# Patient Record
Sex: Male | Born: 1985 | Race: White | Hispanic: No | Marital: Single | Smoking: Former smoker
Health system: Southern US, Community
[De-identification: ages and names within clinical notes are randomized; demographics above are authoritative.]

## PROBLEM LIST (undated history)

## (undated) DIAGNOSIS — I10 Essential (primary) hypertension: Secondary | ICD-10-CM

## (undated) DIAGNOSIS — B019 Varicella without complication: Secondary | ICD-10-CM

## (undated) DIAGNOSIS — F1020 Alcohol dependence, uncomplicated: Secondary | ICD-10-CM

## (undated) DIAGNOSIS — B1920 Unspecified viral hepatitis C without hepatic coma: Secondary | ICD-10-CM

## (undated) DIAGNOSIS — F1910 Other psychoactive substance abuse, uncomplicated: Secondary | ICD-10-CM

## (undated) HISTORY — DX: Varicella without complication: B01.9

## (undated) HISTORY — DX: Alcohol dependence, uncomplicated: F10.20

---
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
## 2005-12-19 ENCOUNTER — Ambulatory Visit: Payer: Self-pay | Admitting: Internal Medicine

## 2006-11-06 ENCOUNTER — Ambulatory Visit: Payer: Self-pay | Admitting: Internal Medicine

## 2006-11-07 ENCOUNTER — Emergency Department (HOSPITAL_COMMUNITY): Admission: EM | Admit: 2006-11-07 | Discharge: 2006-11-07 | Payer: Self-pay | Admitting: Emergency Medicine

## 2007-03-30 ENCOUNTER — Telehealth (INDEPENDENT_AMBULATORY_CARE_PROVIDER_SITE_OTHER): Payer: Self-pay | Admitting: *Deleted

## 2008-01-19 ENCOUNTER — Telehealth: Payer: Self-pay | Admitting: Internal Medicine

## 2008-03-26 HISTORY — PX: HERNIA REPAIR: SHX51

## 2008-04-08 ENCOUNTER — Emergency Department (HOSPITAL_COMMUNITY): Admission: EM | Admit: 2008-04-08 | Discharge: 2008-04-08 | Payer: Self-pay | Admitting: Emergency Medicine

## 2008-04-10 ENCOUNTER — Emergency Department (HOSPITAL_COMMUNITY): Admission: EM | Admit: 2008-04-10 | Discharge: 2008-04-10 | Payer: Self-pay | Admitting: Emergency Medicine

## 2008-04-11 ENCOUNTER — Encounter: Payer: Self-pay | Admitting: Internal Medicine

## 2008-06-14 ENCOUNTER — Encounter: Payer: Self-pay | Admitting: Internal Medicine

## 2008-07-05 ENCOUNTER — Ambulatory Visit: Payer: Self-pay | Admitting: Internal Medicine

## 2008-07-05 DIAGNOSIS — M4120 Other idiopathic scoliosis, site unspecified: Secondary | ICD-10-CM | POA: Insufficient documentation

## 2008-07-05 DIAGNOSIS — S42309A Unspecified fracture of shaft of humerus, unspecified arm, initial encounter for closed fracture: Secondary | ICD-10-CM | POA: Insufficient documentation

## 2008-07-05 DIAGNOSIS — Z9189 Other specified personal risk factors, not elsewhere classified: Secondary | ICD-10-CM | POA: Insufficient documentation

## 2008-07-05 DIAGNOSIS — J069 Acute upper respiratory infection, unspecified: Secondary | ICD-10-CM | POA: Insufficient documentation

## 2008-07-15 ENCOUNTER — Emergency Department (HOSPITAL_COMMUNITY): Admission: EM | Admit: 2008-07-15 | Discharge: 2008-07-16 | Payer: Self-pay | Admitting: Emergency Medicine

## 2009-08-16 ENCOUNTER — Emergency Department (HOSPITAL_COMMUNITY): Admission: EM | Admit: 2009-08-16 | Discharge: 2009-08-16 | Payer: Self-pay | Admitting: Emergency Medicine

## 2009-12-07 ENCOUNTER — Ambulatory Visit: Payer: Self-pay | Admitting: Internal Medicine

## 2009-12-07 DIAGNOSIS — J019 Acute sinusitis, unspecified: Secondary | ICD-10-CM

## 2010-09-25 NOTE — Assessment & Plan Note (Signed)
Summary: CONGESTES//PH   Vital Signs:  Patient profile:   25 year old male Weight:      201.6 pounds Temp:     98.6 degrees F oral Pulse rate:   80 / minute Resp:     15 per minute BP sitting:   116 / 78  (left arm) Cuff size:   large  Vitals Entered By: Shonna Chock (December 07, 2009 10:41 AM) CC: Congestion and intense sinus pressure since Saturday Comments REVIEWED MED LIST, PATIENT AGREED DOSE AND INSTRUCTION CORRECT    CC:  Congestion and intense sinus pressure since Saturday.  History of Present Illness: Onset 12/02/2009 as R frontal headache followed by initial improvement until 04/12. Now headache  has recurred with pain in OD. Rx: antihistamine, Mucinex D, NSAIDS  Allergies (verified): No Known Drug Allergies  Review of Systems General:  Denies chills, fever, and sweats. Eyes:  Complains of discharge and eye pain; denies red eye and vision loss-1 eye. ENT:  Complains of sinus pressure and sore throat; denies ear discharge and earache; No facial pain; purulence from  R nare since last week. Resp:  Complains of cough, sputum productive, and wheezing; denies shortness of breath; dark yellow-brown sputum , > volume from head. No PMH of asthma. He quick smoking 1 week ago.  Physical Exam  General:  well-nourished,in no acute distress; alert,appropriate and cooperative throughout examination Eyes:  No corneal or conjunctival inflammation noted. EOMI. Perrla. Slight ptosis OD. Vision grossly normal. Ears:  External ear exam shows no significant lesions or deformities.  Otoscopic examination reveals clear canals, tympanic membranes are intact bilaterally without bulging, retraction, inflammation or discharge. Hearing is grossly normal bilaterally. Nose:  External nasal examination shows no deformity or inflammation. Nasal mucosa are erythematous without lesions or exudates. Mouth:  Oral mucosa and oropharynx without lesions or exudates.  Teeth in good repair. Lungs:  Normal  respiratory effort, chest expands symmetrically. Lungs are clear to auscultation, no crackles or wheezes. Heart:  Normal rate and regular rhythm. S1 and S2 normal without gallop, murmur, click, rub.S4  Cervical Nodes:  No lymphadenopathy noted Axillary Nodes:  No palpable lymphadenopathy   Impression & Recommendations:  Problem # 1:  SINUSITIS- ACUTE-NOS (ICD-461.9) No clinical evidence of conjuctivitis or orbital cellulitis The following medications were removed from the medication list:    Amoxicillin 500 Mg Caps (Amoxicillin) .Marland Kitchen... 1 three times a day His updated medication list for this problem includes:    Amoxicillin-pot Clavulanate 875-125 Mg Tabs (Amoxicillin-pot clavulanate) .Marland Kitchen... 1 q 12 hrs with a meal  Complete Medication List: 1)  Amoxicillin-pot Clavulanate 875-125 Mg Tabs (Amoxicillin-pot clavulanate) .Marland Kitchen.. 1 q 12 hrs with a meal  Patient Instructions: 1)  Neti pot once daily - two times a day from open to closed side. Nasal spray sample two times a day to Rnasal passage. 2)  Drink as much fluid as you can tolerate for the next few days. Prescriptions: AMOXICILLIN-POT CLAVULANATE 875-125 MG TABS (AMOXICILLIN-POT CLAVULANATE) 1 q 12 hrs with a meal  #20 x 0   Entered and Authorized by:   Marga Melnick MD   Signed by:   Marga Melnick MD on 12/07/2009   Method used:   Faxed to ...       [facility] (retail)       [address]       [place], [place]  [address]       Ph: [phone]  Fax: [phone]   RxID:   [id number]

## 2010-11-26 LAB — CBC
HCT: 45.5 % (ref 39.0–52.0)
Hemoglobin: 15.6 g/dL (ref 13.0–17.0)
MCHC: 34.3 g/dL (ref 30.0–36.0)
MCV: 95.5 fL (ref 78.0–100.0)
Platelets: 166 10*3/uL (ref 150–400)
RBC: 4.76 MIL/uL (ref 4.22–5.81)
RDW: 11.8 % (ref 11.5–15.5)
WBC: 6.1 10*3/uL (ref 4.0–10.5)

## 2010-11-26 LAB — POCT I-STAT, CHEM 8
BUN: 9 mg/dL (ref 6–23)
Calcium, Ion: 1.09 mmol/L — ABNORMAL LOW (ref 1.12–1.32)
Chloride: 107 mEq/L (ref 96–112)
Glucose, Bld: 102 mg/dL — ABNORMAL HIGH (ref 70–99)

## 2010-11-26 LAB — RAPID URINE DRUG SCREEN, HOSP PERFORMED
Amphetamines: NOT DETECTED
Barbiturates: NOT DETECTED
Benzodiazepines: NOT DETECTED
Cocaine: POSITIVE — AB
Opiates: NOT DETECTED
Tetrahydrocannabinol: POSITIVE — AB

## 2010-11-26 LAB — DIFFERENTIAL
Lymphocytes Relative: 28 % (ref 12–46)
Monocytes Absolute: 1.1 10*3/uL — ABNORMAL HIGH (ref 0.1–1.0)
Monocytes Relative: 18 % — ABNORMAL HIGH (ref 3–12)
Neutro Abs: 2.9 10*3/uL (ref 1.7–7.7)

## 2011-05-24 LAB — URINALYSIS, ROUTINE W REFLEX MICROSCOPIC
Bilirubin Urine: NEGATIVE
Glucose, UA: NEGATIVE
Ketones, ur: NEGATIVE
pH: 6

## 2011-05-28 LAB — URINALYSIS, ROUTINE W REFLEX MICROSCOPIC
Glucose, UA: NEGATIVE
Protein, ur: NEGATIVE
pH: 6

## 2011-05-28 LAB — DIFFERENTIAL
Basophils Absolute: 0
Lymphocytes Relative: 21
Monocytes Absolute: 0.8
Neutro Abs: 6
Neutrophils Relative %: 63

## 2011-05-28 LAB — CBC
HCT: 43.7
Hemoglobin: 15.1
MCV: 94.6
Platelets: 215
WBC: 9.6

## 2011-05-28 LAB — COMPREHENSIVE METABOLIC PANEL
Albumin: 3.5
BUN: 13
Chloride: 106
Creatinine, Ser: 1.29
GFR calc non Af Amer: >60
Glucose, Bld: 97
Total Bilirubin: 0.9

## 2011-05-28 LAB — RAPID URINE DRUG SCREEN, HOSP PERFORMED
Barbiturates: NOT DETECTED
Benzodiazepines: POSITIVE — AB
Cocaine: POSITIVE — AB

## 2011-07-02 ENCOUNTER — Ambulatory Visit (HOSPITAL_COMMUNITY)
Admission: RE | Admit: 2011-07-02 | Discharge: 2011-07-02 | Disposition: A | Discharge status: Home / Self Care | Payer: BC Managed Care – PPO | Attending: Psychiatry | Admitting: Psychiatry

## 2011-07-02 DIAGNOSIS — F1020 Alcohol dependence, uncomplicated: Secondary | ICD-10-CM | POA: Insufficient documentation

## 2011-07-02 NOTE — Progress Notes (Signed)
Assessment Note   Charles Hunter is an 25 y.o. male.   Axis I: Substance Abuse and ALCOHOL DEPENDENCE Axis II: Deferred Axis III: No past medical history on file. Axis IV: problems related to social environment Axis V: 41-50 serious symptoms  Past Medical History: No past medical history on file.  No past surgical history on file.  Family History: No family history on file.  Social History:  does not have a smoking history on file. He does not have any smokeless tobacco history on file. His alcohol and drug histories not on file.  Allergies: Allergies not on file  Home Medications:  No current outpatient prescriptions on file as of 07/02/2011.   No current facility-administered medications on file as of 07/02/2011.    OB/GYN Status:  No LMP for male patient.   PT PRESENTS REQUESTING HELP WITH IS SUBSTANCE ABUSE & STAYING SOBER. PT STATES HE WAS SOBER FOR 4 YRS & RELAPSED AT AGE 1. PT ADMITS DRINKING REGULARLY AS WELL AS BINGES. PT STATES HE HAD TRIED DOING CD IOP WITH [facility] BUT FELT THAT THE PROGRAM WAS NOT HELPFUL. PT STATES HE HAS ABUSED COCAINE, ETOH, THC & SPEED(METH). PT EXPRESSED HE HAS NOT HAD ANY SLEEP IN DAYS & EATING HAS NOT BEEN GOOD. PT DENIES SI, HI OR AV. PT EXPRESSED THAT HE DID NOT WANT TO BE INPT CAUSE HE HAD A LOT OF THINGS THAT REQUIRED HIS ATTENTION AT HOME. PT ADMITS TO SOME WITHDRAWAL SYMPTOMS INCLUDING ANXIETY, AGITATED. PT WAS FIDIGITY, UNEASY, SALAD WORDS. PT HAS AGREED TO FOLLOW UP WITH ANN EVANS FOR ADMISSION INTO CD IOP.                                          Disposition:     On Site Evaluation by:   Reviewed with Physician:     Waldron Session 07/02/2011 3:01 PM

## 2011-07-03 ENCOUNTER — Encounter (HOSPITAL_COMMUNITY): Payer: Self-pay | Admitting: *Deleted

## 2011-07-03 ENCOUNTER — Encounter (HOSPITAL_COMMUNITY): Payer: Self-pay | Admitting: Physician Assistant

## 2011-07-03 ENCOUNTER — Other Ambulatory Visit (HOSPITAL_COMMUNITY): Payer: BC Managed Care – PPO | Attending: Physician Assistant | Admitting: Psychology

## 2011-07-03 DIAGNOSIS — F1020 Alcohol dependence, uncomplicated: Secondary | ICD-10-CM | POA: Insufficient documentation

## 2011-07-03 NOTE — Progress Notes (Signed)
  Met with pt to complete orientation to CD-IOP. Pt has not had any alcohol for 30 hours. He reports sleeping well last night but that he is not feeling well today. Reports being shaky and disoriented. Pt confirms that he has not ever had seizures from alcohol detox but this writer reminded him to go to the ED if he deteriorates and informed him that he will be seen by Jorje Guild, PA-C as soon as possible once in the program. Pt has been drinking at least 12 beers/day and reports up to 50 beers in 24 hours at times. He has 4 years sobriety from age [age] through insight program. He has been in [facility]'s IOP for the past 2 weeks but wants to transfer to [facility]. He attended an AA meeting last night and reports that he has been trying different ones almost daily to try to build a support system of young people. Pt uses other drugs occasionally, especially meth and hallucinogens. Last use of marijuana and meth is 06/29/11. Pt is living alone however, he signed a release of information for this mother, Rigel Filsinger. The plan is for him to start CD-IOP today and meet for individual session on 11/ 14/ 12 at 11 am.

## 2011-07-04 ENCOUNTER — Other Ambulatory Visit (HOSPITAL_COMMUNITY): Payer: Self-pay | Admitting: *Deleted

## 2011-07-04 DIAGNOSIS — F1020 Alcohol dependence, uncomplicated: Secondary | ICD-10-CM

## 2011-07-04 NOTE — Progress Notes (Signed)
    Daily Group Progress Note  Program: CD-IOP   Group Time: 1-2:30 pm  Participation Level: Active  Behavioral Response: Appropriate, Sharing, Motivated and Assertive  Type of Therapy: Psycho-education Group  Topic: The Disease Concept of Addiction; A presentation was provided on the chronic nature of addiction. Emphasis was placed on "managing" the disease through a daily program. The importance of routine in observing the daily schedule was reiterated. The session demonstrated that living a quality life through daily management of symptoms is very possible. There was good disclosure among group members and many agreed that complacency had brought about their relapses in the past.         Group Time: 2:45-4 pm  Participation Level: Active  Behavioral Response: Appropriate and Sharing  Type of Therapy: Process Group  Topic: Group Process and Graduation: second half of session spent in process. Members were asked to share their feelings about current struggles and issues in early recovery. As the session neared the end, a graduation ceremony was held for a member who was graduating successfully from the program.   Summary: Patient was new to the group, but spoke openly about his addiction. He admitted that his sobriety date was today. He explained that he had been in treatment before and, in fact, laughed and pointed out that he and a woman in this group had been in treatment together as teenagers. He reported he was very familiar with the 12-step community and had attained some sobriety. When asked what had happened, the patient admitted he had become complacent. He identified boredom as being a very big problem for him and reported he tries to keep himself busy. He received good feedback and responded well to this his first session.   Family Program: Family present? No   Name of family member(s):   UDS collected: No Results: negative  AA/NA attended?: No }  Sponsor?:  No   Sir Mallis, LCAS

## 2011-07-05 ENCOUNTER — Other Ambulatory Visit (HOSPITAL_COMMUNITY): Payer: Self-pay | Admitting: Physician Assistant

## 2011-07-05 ENCOUNTER — Other Ambulatory Visit (HOSPITAL_COMMUNITY): Payer: BC Managed Care – PPO | Admitting: Psychology

## 2011-07-05 DIAGNOSIS — F1020 Alcohol dependence, uncomplicated: Secondary | ICD-10-CM

## 2011-07-06 LAB — DRUGS OF ABUSE SCREEN W/O ALC, ROUTINE URINE
Amphetamine Screen, Ur: NEGATIVE
Barbiturate Quant, Ur: NEGATIVE
Creatinine,U: 106.5 mg/dL
Marijuana Metabolite: POSITIVE — AB
Methadone: NEGATIVE

## 2011-07-08 ENCOUNTER — Other Ambulatory Visit (HOSPITAL_COMMUNITY): Payer: BC Managed Care – PPO

## 2011-07-08 NOTE — Progress Notes (Signed)
    Daily Group Progress Note  Program: CD-IOP   Group Time: 1-2:30 pm  Participation Level: Active  Behavioral Response: Appropriate, Sharing and Assertive  Type of Therapy: Psycho-education Group  Topic: The Wheel of Life; an exercise and presentation on the Wheel of Life. A wheel was drawn on the board divided into 8 different segments representing the various aspects of one's life. Members were given a handout and asked to chart where they are relative to each segment in their current lives. By connecting the 8 segments, one is able to see how "smoothly" their wheel would roll. The bumpier one's ride, the more inconsistent one is doing in the various segments. Each member was invited to come to the board, draw his or her wheel and explain why they were where they are in each segment.        Group Time: 2:45-4 pm  Participation Level: Active  Behavioral Response: Appropriate, Sharing and Assertive  Type of Therapy: Process Group  Topic: Group Process ; members were asked to share their feelings about current struggles and issues. There was good feedback and disclosure and the group responded well to this intervention.   Summary: The patient was engaged and attentive in group today. He complained about having trouble keeping his eyes open and explained that he was still exhausted and coming down from the stimulant-fed past weekend. Charles Hunter drew his wheel of life on the board. It was very skewed, which was to be expected. He pointed out, though, that his friendships, spirituality and recreation will all be positively effected by his daily engagement in [place]. He agreed with my concerns about living about himself and the "natural" potential for isolation, which he has admitted is a big problem for him. The patient is quite open about his addiction and the his manipulative skills. He acknowledged that he needs to find a job. During the second half of group, the patient met with the Medical  Director for his initial visit.   Family Program: Family present? No   Name of family member(s):   UDS collected: Yes Results:   AA/NA attended?: YesMonday, Tuesday, Wednesday, Thursday, Friday, Saturday and Sunday  Sponsor?: No   Charles Hunter, LCAS

## 2011-07-10 ENCOUNTER — Other Ambulatory Visit (HOSPITAL_COMMUNITY): Payer: BC Managed Care – PPO | Admitting: Psychology

## 2011-07-10 LAB — THC (MARIJUANA), URINE, CONFIRMATION: Marijuana, Ur-Confirmation: 1165 NG/ML — ABNORMAL HIGH

## 2011-07-10 NOTE — Progress Notes (Unsigned)
Psychiatric Admission Assessment Adult  Patient Identification:  Nakoa Ganus, 25 yo single white male Date of Evaluation:  07/10/2011  Chief Complaint:Unable to control his alcohol and cannabis consumption  History of Present Illness:: Gerri Spore referred his self to the IOP program after experiencing a relapse over the past five years on alcohol and cannabis.  He reports he has been drinking daily, and stopped abruptly on the 7th of this month.  He denies any history of withdrawal seizures.  He denies any current tremors or dizziness, but endorses a feeling of general malaise.     Past Psychiatric History: Attended an outpatient treatment program in [place], [place] at age 40, and enjoyed a period of sobriety until he relapsed at age 60.  Past Medical History:    Reports a recent diagnosis of HTN.    No past surgical history on file.  Allergies: Allergies not on file.  Denies any known allergies  Current Medications:  Prior to Admission medications   None    Social History:    does not have a smoking history on file. He does not have any smokeless tobacco history on file. His alcohol and drug histories not on file.   Family History:    No family history on file.  Mental Status Examination/Evaluation: Objective:  Appearance: Well Groomed  Psychomotor Activity:  Normal  Eye Contact::  Good  Speech:  Clear and Coherent  Volume:  Normal  Mood:  Euthymic  Affect:  Congruent  Thought Process:  Linear  Orientation:  Full  Thought Content:  WNL  Suicidal Thoughts:  No  Homicidal Thoughts:  No  Judgement:  Impaired  Insight:  Shallow    Assessment:    AXIS I 303.90  AXIS II No diagnosis  AXIS III See medical history.  AXIS IV educational problems and occupational problems  AXIS V 51-60 moderate symptoms     Treatment Plan Summary: Group therapy three days per week. Encourage 12-step meetings at least four days per week. Medication management if necessary Refer  for continued outpatient care if necessary.

## 2011-07-11 ENCOUNTER — Encounter (HOSPITAL_COMMUNITY): Payer: Self-pay | Admitting: *Deleted
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
## 2011-07-11 NOTE — Progress Notes (Signed)
    Daily Group Progress Note  Program: CD-IOP   Group Time: 1-2:30 pm  Participation Level: Active  Behavioral Response: Appropriate and Sharing  Type of Therapy: Psycho-education Group  Topic:  Post Acute Withdrawal Symptoms. A presentation and handout was provided on PAWS. These are the very common and frequently experienced symptoms in early recovery. They include memory loss, irritability, insomnia, clumsiness and a number of other impairments that are typically experienced in early recovery. The importance of being patient with one's self and realizing that there is nothing wrong is very critical. Members shared their own particular experiences with PAWS and there was not one group member who had not experienced at least 1 of these symptoms.       Group Time: 2:45- 4 pm  Participation Level: Active  Behavioral Response: Appropriate and Sharing  Type of Therapy: Process Group  Topic: Process group and graduation; second half of group spent in process with members sharing their feelings about current struggles and issues in early recovery. One member was successfully completing the program this afternoon and a graduation ceremony was held as the session concluded.    Summary: the patient continues to attend 12 step meetings and remains abstinent. He complained about sleep problems and agreed that there were a number of symptoms of PAWS he was currently experiencing. He reported he is going to begin looking for a job. He agreed that boredom is a problem for him and he was able to articulate his plans to insure he didn't isolate or find himself with nothing to do. He spoke some kind words to the graduating member. He responded well to this intervention.    Family Program: Family present? No   Name of family member(s):  UDS collected: No Results:  AA/NA attended?: YesMonday, Tuesday, Wednesday, Thursday, Friday, Saturday and Sunday  Sponsor?: Yes   Tashauna Caisse,  LCAS

## 2011-07-12 ENCOUNTER — Other Ambulatory Visit (HOSPITAL_COMMUNITY): Payer: Self-pay | Admitting: Physician Assistant

## 2011-07-12 ENCOUNTER — Other Ambulatory Visit (HOSPITAL_COMMUNITY): Payer: BC Managed Care – PPO | Admitting: Psychology

## 2011-07-12 DIAGNOSIS — F329 Major depressive disorder, single episode, unspecified: Secondary | ICD-10-CM

## 2011-07-12 MED ORDER — MIRTAZAPINE 15 MG PO TABS: ORAL_TABLET | ORAL | Status: DC

## 2011-07-12 NOTE — Progress Notes (Unsigned)
   [facility] Behavioral Health Follow-up Outpatient Visit  Charles Hunter [birth year]  Date:    Subjective:Patient c/o of 2 hour sleep latency.  Reports he is practicing good sleep hygeine - gets up at 7:30 every morning, not napping during day, avoids caffeine late, reads for about an hour prior to going to bed.  Wakes in the morning and has to force his self up.  There were no vitals filed for this visit.  Mental Status Examination  Appearance: *** Alert: {BHH YES OR NO:22294} Attention: {Desc; good/fair/poor:18582} Cooperative: {BHH YES OR NO:22294} Eye Contact: {BHH EYE CONTACT:22684} Speech: *** Psychomotor Activity: {Psychomotor (PAA):22696} Memory/Concentration: *** Oriented: {orientation:30299} Mood: {BHH MOOD:22306}*** Affect: {Affect (PAA):22687} Thought Processes and Associations: {Thought Process (PAA):22688} Fund of Knowledge: {BHH JUDGMENT:22312} Thought Content: {CHL AMB BH Thought Content:21022752} Insight: {BHH JUDGMENT:22312} Judgement: {BHH JUDGMENT:22312}  Diagnosis: ***  Treatment Plan: ***  Bh-[facility]iop[facility] Chem

## 2011-07-15 ENCOUNTER — Other Ambulatory Visit (HOSPITAL_COMMUNITY): Payer: BC Managed Care – PPO | Admitting: Psychology

## 2011-07-15 ENCOUNTER — Encounter (HOSPITAL_COMMUNITY): Payer: Self-pay | Admitting: *Deleted

## 2011-07-15 NOTE — Progress Notes (Signed)
    Daily Group Progress Note  Program: CD-IOP   Group Time: 1-2:30 pm  Participation Level: Active  Behavioral Response: Appropriate and Sharing  Type of Therapy: Activity Group  Topic: Gentle Yoga: A certified yoga instructor visited the group this afternoon. She led the group om yoga stretches and poses. The group responded well and concurred that they felt relaxed and more open at the conclusion of the session. The purpose of this session was to demonstrate the way one can change their feelings quickly and in a healthy manner. There was good feedback and the group responded well to this intervention     Group Time: 2:45-4pm  Participation Level: Active  Behavioral Response: Appropriate, Sharing and Assertive  Type of Therapy: Process Group  Topic: Group Process ; second half of group spent in process. Members shared their feelings about current issues and struggles in early recovery. Discussion followed on plans for the weekend that will support their efforts in sobriety.    Summary: The patient reported he continues to struggle with lack of sleep. He noted that he is working very hard to establish a new routine and that includes getting up around 7:30 am whether he has slept well or not. [name] reported that he is going to attend a funeral tomorrow evening for a friend who recently died of an overdose. When I expressed concerns about the temptation and challenges of being around other friends who are still using, he agreed that there will definitely be a lot of addicts at the funeral. The group discussed various strategies he might consider and the patient agreed to make a plan with his sponsor before he leaves for the ceremony. During process, the member met with the Medical Director to discuss his insomnia and any recommendations the director may have. The patient remains sober and committed to his recovery.   Family Program: Family present? No   Name of family member(s):    UDS collected: No Results:  AA/NA attended?: YesMonday, Tuesday, Wednesday, Thursday, Friday, Saturday and Sunday  Sponsor?: Yes   Ryland Tungate, LCAS

## 2011-07-16 NOTE — Progress Notes (Signed)
    Daily Group Progress Note  Program: CD-IOP   Group Time: 1-2:30 pm  Participation Level: Active  Behavioral Response: Appropriate and Sharing  Type of Therapy: Psycho-education Group  Topic: Values: A presentation was made on Values and the way one's addiction "hijacks" one's values. Group members were provided with a handout listing many different values. They were asked to identify their top 3. Members then shared some of the values they had identified. A discussion ensued about the ways in which members abandoned their values while in their active addiction. There was good disclosure and members were very candid about the things they had done in their active addiction that conflicted entirely with what they value.      Group Time: 2:45-4pm  Participation Level: Active  Behavioral Response: Sharing  Type of Therapy: Process Group  Topic: Process group; second half of group spent in process. Members shared their feelings in early recovery. Some of the members complained about irritability and mood swings while two others expressed frustration over sleeplessness. One member brought his S/O and she talked about how his drinking had impacted her.    Summary: Patient reported he had a good weekend and remained sober. He admitted that there were lots of active addicts at the funeral on Saturday and he felt angry towards them. Charles Hunter reported he is sleeping a little better, but he feels almost drugged in the mornings. I assured him that this was just an early symptom of the sleep medication he has recently started and will disappear as his body becomes more familiar with the medication. In the process session, Charles Hunter wondered why the group doesn't try to meet and spend more time outside of sessions. He pointed out that they know each other and feel safe. It would seem like a good thing to do something outside of group. Other members responded enthusiastically to this suggestion and discussed  some possible things they might do. The patient made some good comments and remains abstinent.   Family Program: Family present? No   Name of family member(s):   UDS collected: No Results:   AA/NA attended?: YesMonday, Tuesday, Wednesday, Thursday, Friday, Saturday and Sunday  Sponsor?: Yes   Retia Cordle, LCAS

## 2011-07-17 ENCOUNTER — Other Ambulatory Visit (HOSPITAL_COMMUNITY): Payer: BC Managed Care – PPO

## 2011-07-17 ENCOUNTER — Encounter (HOSPITAL_COMMUNITY): Payer: Self-pay | Admitting: Psychology

## 2011-07-19 ENCOUNTER — Other Ambulatory Visit (HOSPITAL_COMMUNITY): Payer: BC Managed Care – PPO

## 2011-07-22 ENCOUNTER — Encounter (HOSPITAL_COMMUNITY): Payer: Self-pay | Admitting: Psychology

## 2011-07-22 ENCOUNTER — Other Ambulatory Visit (HOSPITAL_COMMUNITY): Payer: BC Managed Care – PPO

## 2011-07-22 ENCOUNTER — Emergency Department (HOSPITAL_COMMUNITY)
Admission: EM | Admit: 2011-07-22 | Discharge: 2011-07-22 | Disposition: A | Discharge status: Home / Self Care | Payer: BC Managed Care – PPO | Attending: Emergency Medicine | Admitting: Emergency Medicine

## 2011-07-22 ENCOUNTER — Encounter (HOSPITAL_COMMUNITY): Payer: Self-pay | Admitting: Emergency Medicine

## 2011-07-22 DIAGNOSIS — F17200 Nicotine dependence, unspecified, uncomplicated: Secondary | ICD-10-CM | POA: Insufficient documentation

## 2011-07-22 DIAGNOSIS — L089 Local infection of the skin and subcutaneous tissue, unspecified: Secondary | ICD-10-CM | POA: Insufficient documentation

## 2011-07-22 DIAGNOSIS — T07XXXA Unspecified multiple injuries, initial encounter: Secondary | ICD-10-CM | POA: Insufficient documentation

## 2011-07-22 DIAGNOSIS — W5503XA Scratched by cat, initial encounter: Secondary | ICD-10-CM

## 2011-07-22 DIAGNOSIS — W64XXXA Exposure to other animate mechanical forces, initial encounter: Secondary | ICD-10-CM | POA: Insufficient documentation

## 2011-07-22 HISTORY — DX: Essential (primary) hypertension: I10

## 2011-07-22 MED ORDER — DOXYCYCLINE HYCLATE 100 MG PO CAPS: 100.0000 mg | ORAL_CAPSULE | Freq: Two times a day (BID) | ORAL | Status: AC

## 2011-07-22 MED ORDER — AMOXICILLIN-POT CLAVULANATE 875-125 MG PO TABS: 1.0000 | ORAL_TABLET | Freq: Two times a day (BID) | ORAL | Status: AC

## 2011-07-22 MED ORDER — TETANUS-DIPHTH-ACELL PERTUSSIS 5-2.5-18.5 LF-MCG/0.5 IM SUSP
0.5000 mL | Freq: Once | INTRAMUSCULAR | Status: AC
  Administered 2011-07-22: 0.5 mL via INTRAMUSCULAR
  Filled 2011-07-22: qty 0.5

## 2011-07-22 NOTE — ED Provider Notes (Signed)
Medical screening examination/treatment/procedure(s) were performed by non-physician practitioner and as supervising physician I was immediately available for consultation/collaboration.   Dayton Bailiff, MD 07/22/11 1536

## 2011-07-22 NOTE — ED Provider Notes (Signed)
History     CSN: [id number] Arrival date & time: 07/22/2011  1:33 PM   First MD Initiated Contact with Patient 07/22/11 1334     HPI Patient initially tells the nurse that he was bitten by a cat. Later states it was actually more of a laceration and scratched with it's nails. Reports wound healed completely but last night began to notice mild swelling and erythema. He states today he has 3 pustules on his dorsal right hand. Denies tenderness, warmth, fever, significant drainage. Patient is a 25 y.o. male presenting with animal bite. The history is provided by the patient.  Animal Bite  Episode onset: 2 weeks ago. There is an injury to the right hand. The patient is experiencing no pain. Pertinent negatives include no chest pain, no numbness, no nausea, no vomiting, no neck pain and no tingling. He is right-handed.    Past Medical History  Diagnosis Date  . Alcohol dependence   . Hypertension     Past Surgical History  Procedure Date  . Hernia repair     History reviewed. No pertinent family history.  History  Substance Use Topics  . Smoking status: Current Everyday Smoker -- 0.5 packs/day  . Smokeless tobacco: Not on file  . Alcohol Use: 84.0 oz/week    140 Cans of beer per week     occasionally      Review of Systems  Constitutional: Negative for fever and chills.  HENT: Negative for neck pain.   Respiratory: Negative for shortness of breath.   Cardiovascular: Negative for chest pain.  Gastrointestinal: Negative for nausea and vomiting.  Skin: Positive for color change and wound.  Neurological: Negative for tingling and numbness.  All other systems reviewed and are negative.    Allergies  Review of patient's allergies indicates no known allergies.  Home Medications   Current Outpatient Rx  Name Route Sig Dispense Refill  . MIRTAZAPINE 15 MG PO TABS  1/2 to one tablet by mouth at bedtime as needed for sleep 30 tablet 0    BP 143/89  Pulse 115  Temp(Src)  99 F (37.2 C) (Oral)  Resp 20  SpO2 100%  Physical Exam  Vitals reviewed. Constitutional: He is oriented to person, place, and time. He appears well-developed and well-nourished.  HENT:  Head: Normocephalic and atraumatic.  Eyes: Pupils are equal, round, and reactive to light.  Musculoskeletal:       Left hand: He exhibits normal range of motion, no tenderness, normal capillary refill and no laceration. normal sensation noted. Normal strength noted.       Hands:      3 areas on right dorsal hand that are erythematous with a small pustule in centrally. Nontender to palpation. No warmth to palpation. No abscesses or masses palpated. Suspect mild cellulitis.  Neurological: He is alert and oriented to person, place, and time.  Skin: Skin is warm and dry. No rash noted. No erythema. No pallor.  Psychiatric: He has a normal mood and affect. His behavior is normal.    ED Course  Procedures   Is nontender. Low suspicion for abscess or osteomyelitis. Likely suspect beginning stages of cellulitis. Do not feel x-rays currently necessary do to exam. Will treat patient with both Augmentin and doxycycline. Patient  currently does not need analgesics. Advised ibuprofen if needed for additional pain control. MDM          Thomasene Lot, PA 07/22/11 1442  Thomasene Lot, PA 07/22/11 1444

## 2011-07-22 NOTE — ED Notes (Signed)
Pt presenting to ed with c/o possible abcess s/p laceration x 2 weeks ago

## 2011-07-22 NOTE — Progress Notes (Signed)
Patient ID: Charles Hunter, male   DOB: [birth year], 25 y.o.   MRN: [id number] Patient phoned on Wednesday morning and reported he had an interview with [employer] at 1 pm this afternoon. He noted that he did not anticipate it taking very long and he would come to group, but be late. I encouraged him to come when he could, but he did not appear for the session nor did he phone. Will wait to hear how the interview went next Monday.

## 2011-07-23 NOTE — Progress Notes (Signed)
Patient ID: Charles Hunter, male   DOB: 04/23/1986, 25 y.o.   MRN: [id number] The patient left a voice mail message this afternoon. He phoned to report that he was in the [facility] waiting to be seen. He explained that his hand has a cut which has gotten infected and he is trying to see a doctor. He apologized, but noted that he would appear for group as soon as he was able to get the wound cleaned. He admitted he had been waiting for hours. The patient did not appear for group today. I had noticed a bad wound on his right hand and had questioned whether it was an abcess. Will wait to hear or see him on Wednesday.

## 2011-07-24 ENCOUNTER — Other Ambulatory Visit (HOSPITAL_COMMUNITY): Payer: BC Managed Care – PPO | Admitting: Psychology

## 2011-07-24 ENCOUNTER — Telehealth (HOSPITAL_COMMUNITY): Payer: Self-pay | Admitting: Emergency Medicine

## 2011-07-24 DIAGNOSIS — F1920 Other psychoactive substance dependence, uncomplicated: Secondary | ICD-10-CM

## 2011-07-24 NOTE — Progress Notes (Signed)
   THERAPIST PROGRESS NOTE  Session Time: 11:00 AM  Participation Level: Active  Behavioral Response: Fairly GroomedAlertDysphoric  Type of Therapy: Individual Therapy  Treatment Goals addressed: Coping and Diagnosis: Discussed further the ramifications of his past drug use and the role cravings and addiction play in his every day life.   Interventions: Solution Focused, Strength-based, Supportive and Other: Explored the toll chemical dependency has taken on his life  Summary: Jaevian Shean is a 25 y.o. male who presents with alcohol dependence. He has remained alcohol and drug free since 07/03/11 per self report and UDS. [name] was very open and honest during the session. His years of sobriety as a teenager left him with some significant coping skills and refusal skills. I was sure to point these out to him in light of recent exposure he had to alcohol and his ability to not use. A large portion of the session was spent with the pt exploring and expressing the lifestyle that he used to live when he was dealing and finances were not an issue. He admitted that he has never made a living as a grown up in an [employer]. Validated that pt will experience significant grief and loss regarding his old lifestyle in many areas if he remains sober. Also gave pt a random UDS.   Suicidal/Homicidal: Nowithout intent/plan  Therapist Response: Challenged pt to explore some of the negative consequences of his past drug/alcohol use. Encouraged pt in his refusal and coping skills. Affirmed what he has been doing to stay drug-free; attending AA daily, working with his sponsor, beginning exercise, taking his sleeping meds (Geodon) as prescribed. Explored with pt some of his strengths but want to spend time challenging him to begin to have a vision for a sober life.   Plan: Return again in 1 week.  Diagnosis: Axis I: Alcohol dependence    Axis II: Deferred    Aris Lot,  COUNS 07/24/2011

## 2011-07-25 NOTE — Progress Notes (Signed)
    Daily Group Progress Note  Program: CD-IOP   Group Time: 1-2:30 pm  Participation Level: Active  Behavioral Response: Appropriate and Sharing  Type of Therapy: Psycho-education Group  Topic: Pharmacist: The first half of group was spent with the [facility] Pharmacist, Peggye Fothergill. She engaged the group in a discussion on different medications for various illnesses and the specific issues and complications most typical in the chemically dependent population. Group members asked many questions, especially as relates to mood disorders and sleeplessness. There was good interaction and exchange and the session went well.      Group Time: 2:45-4 pm  Participation Level: Active  Behavioral Response: Appropriate  Type of Therapy: Process Group  Topic: Group Process and Graduation: second half of group spent in process. There were 2 new group members present and they shared about their lives in addiction and what they are wanting from the group. Near the conclusion of the session, a graduation ceremony was held for a graduating member   Summary: The patient was attentive and asked questions about his sleep medication to the pharmacist. He noted that he feels kind of "hungover" the next morning after taking it. The pharmacist explained that sometimes it might take a while before the body adjusts to the new medication and she encouraged him to continue to take it as prescribed. In the process session, the patient admitted his interview at [facility] had not gone well and he felt certain he had failed the drug test. 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 The patient agreed with another about the benefits of residential treatment and pointed out that some can't seem to stop using. The patient continues to attend AA and NA meetings. He made some good comments.    Family Program: Family present? No   Name of family member(s):   UDS collected: No Results:   AA/NA  attended?: YesMonday, Tuesday, Wednesday, Thursday, Friday, Saturday and Sunday  Sponsor?: Yes   Kaylon Laroche, LCAS

## 2011-07-26 ENCOUNTER — Other Ambulatory Visit (HOSPITAL_COMMUNITY): Payer: BC Managed Care – PPO | Admitting: Psychology

## 2011-07-29 ENCOUNTER — Other Ambulatory Visit (HOSPITAL_COMMUNITY): Payer: BC Managed Care – PPO | Attending: Physician Assistant | Admitting: Psychology

## 2011-07-29 ENCOUNTER — Other Ambulatory Visit (HOSPITAL_COMMUNITY): Payer: Self-pay | Admitting: *Deleted

## 2011-07-29 DIAGNOSIS — F1020 Alcohol dependence, uncomplicated: Secondary | ICD-10-CM | POA: Insufficient documentation

## 2011-07-29 DIAGNOSIS — F1920 Other psychoactive substance dependence, uncomplicated: Secondary | ICD-10-CM

## 2011-07-29 NOTE — Progress Notes (Signed)
    Daily Group Progress Note  Program: CD-IOP   Group Time: 1-3:30 pm  Participation Level: Active  Behavioral Response: Appropriate and Sharing  Type of Therapy: Activity Group  Topic:The session today was spent watching the movie, "Flight", with [employer], in a local theatre. 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 The film and main actor clearly displayed the narcissistic thinking and behavior of the addict. It also showed the consequences of continued alcohol and drug use. This session represented an opportunity to educate patients more about addiction, but also about opportunities to have fun and enjoy one's self living sober.        Group Time: 3:30-4 pm  Participation Level: Active  Behavioral Response: Appropriate  Type of Therapy: Process Group  Topic: At the conclusion of the film, group members and therapists gathered in a private room to discuss the film. Members shared their feelings about the film and identified triggers or other instances where they experienced powerful emotions. The discussion included talking about ways to deal with cravings and the importance of "playing the tape out to the end". The session today proved enlightening for the group and an opportunity not experienced in many years for a number of group members.  Summary: The patient was attentive and reported that he really enjoyed the film. He could relate to many of the behaviors and thinking of the main character while in his active addiction. The patient also agreed that there was some music in the film that accompanied the drug and alcohol use and that was also very typical in his own life. He noted that the denial was very difficult to break through. The patient made some good comments and responded  well to this intervention.    Family Program: Family present? No   Name of family member(s):   UDS collected: No Results:    AA/NA attended?: YesMonday, Tuesday, Wednesday, Thursday, Friday, Saturday and Sunday  Sponsor?: Yes   Ofilia Rayon, LCAS

## 2011-07-30 LAB — DRUG SCREEN, URINE
Amphetamine Screen, Ur: POSITIVE — AB
Benzodiazepines.: POSITIVE — AB
Cocaine Metabolites: NEGATIVE
Creatinine,U: 488 mg/dL

## 2011-07-31 ENCOUNTER — Other Ambulatory Visit (HOSPITAL_COMMUNITY): Payer: BC Managed Care – PPO

## 2011-07-31 ENCOUNTER — Encounter (HOSPITAL_COMMUNITY): Payer: Self-pay | Admitting: Psychology

## 2011-08-01 ENCOUNTER — Telehealth (HOSPITAL_COMMUNITY): Payer: Self-pay | Admitting: *Deleted

## 2011-08-01 NOTE — Progress Notes (Signed)
Patient ID: Charles Hunter, male   DOB: 27-Aug-1985, 25 y.o.   MRN: [id number] The patient phoned his therapist (HB) this morning and explained he was with a friend in court in [place], but would be back in time for group this afternoon. The patient did not appear for group today and did not leave a message explaining his absence. Based on the fact that the patient relapsed this past weekend, I am concerned about his well-being. Will wait to hear from him.

## 2011-08-02 ENCOUNTER — Other Ambulatory Visit (HOSPITAL_COMMUNITY): Payer: BC Managed Care – PPO | Admitting: Psychology

## 2011-08-05 ENCOUNTER — Other Ambulatory Visit (HOSPITAL_COMMUNITY): Payer: BC Managed Care – PPO | Admitting: Psychology

## 2011-08-05 DIAGNOSIS — F1920 Other psychoactive substance dependence, uncomplicated: Secondary | ICD-10-CM

## 2011-08-05 NOTE — Progress Notes (Signed)
    Daily Group Progress Note  Program: CD-IOP   Group Time: 1-2:30 pm  Participation Level: Active  Behavioral Response: Appropriate and Sharing  Type of Therapy: Psycho-education Group  Topic: Sleep Hygiene and Self Care: A presentation was provided on the topic of Sleep Hygiene.  A handout was given to all members identifying the 5 stages of sleep and detailed descriptions of each stage of sleep. The discussion went on to discuss general concepts around self-care and the various elements of recovery. There was good discussion about personal issues around sleep and the ways one's active addiction impacted their sleep. One member who had relapsed last weekend admitted his sleep is still very disrupted. There was good feedback and disclosure during this presentation.     Group Time: 2:45-4 pm  Participation Level: Active  Behavioral Response: Appropriate and Sharing  Type of Therapy: Process Group  Topic: Group Process: Members discussed issues they are current dealing with in early recovery. Included in this part of group was a guided relaxation exercise. The purpose of this exercise was to emphasize the importance of group members staying balanced and focused, in part, through relaxation and meditation exercises. A new member was present and she introduced herself and answered numerous questions from fellow group members asking about her history of drugs and degree of acceptance of her chemical dependency.   Summary: The patient explained his absence on Wednesday and apologized. He admitted that the injustice of the legal system makes him very angry and upset. The patient reported he is somewhat confused about his sleep medication and questioned whether he had sleep walked while taking it earlier in the week. He reported he had gotten a chain for his door because that way, if he was sleep walking, he would be more likely to wake up having to make so much effort to get our of the  house. [name] reported he was feeling really badly and I allowed him to leave about 30 minutes early. I encouraged him to go home and get in bed. He admitted he is still feeling the effects of his relapse last weekend. The patient appears very fragile in his recovery and struggling to return to some semblance of stability.   Family Program: Family present? No   Name of family member(s):   UDS collected: No Results:   AA/NA attended?: YesMonday, Tuesday, Wednesday, Thursday, Friday, Saturday and Sunday  Sponsor?: Yes   Charles Hunter, LCAS

## 2011-08-06 LAB — DRUG SCREEN, URINE
Amphetamine Screen, Ur: POSITIVE — AB
Benzodiazepines.: NEGATIVE
Marijuana Metabolite: POSITIVE — AB
Phencyclidine (PCP): NEGATIVE
Propoxyphene: NEGATIVE

## 2011-08-06 NOTE — Progress Notes (Signed)
    Daily Group Progress Note  Program: CD-IOP   Group Time: 1-2:30 pm  Participation Level: None  Behavioral Response: patient offered not comments during this half of group  Type of Therapy: Psycho-education Group  Topic:Chaplain: the first part of group was spent with a visiting chaplain, Leola Brazil. She introduced herself and group members introduced themselves. Elease Hashimoto read from a book she had brought. It focused on the pain that an oyster goes through to build a pearl. The overriding emphasis was on the suffering and how our losses and pain become a part of [place]. The holiday season and particular difficulties that Christmas represents was discussed. Members shared about their own losses and how they have been transformed by the pain and suffering.       Group Time: 2:45-4 pm  Participation Level: Minimal  Behavioral Response: shared very little of himself  Type of Therapy: Process Group  Topic: Group Process; second half of group was spent in process. Members discussed their feelings and current struggles. Some talked about how they are doing in recovery while one member recounted a very close call he had had over the weekend. There was good disclosure and feedback among the group.    Summary: the patient did not speak during the visit with the Chaplain. During process, he repored he is feeling better, but still tired. He reported he is attending meetings and talking with his sponsor. He contributed very little of himself to the group session today.    Family Program: Family present? No   Name of family member(s):   UDS collected: Yes Results: Marijuana  AA/NA attended?: YesMonday, Tuesday, Wednesday, Thursday, Friday, Saturday and Sunday  Sponsor?: Yes   Romuald Mccaslin, LCAS

## 2011-08-07 ENCOUNTER — Telehealth (HOSPITAL_COMMUNITY): Payer: Self-pay | Admitting: *Deleted

## 2011-08-07 ENCOUNTER — Other Ambulatory Visit (HOSPITAL_COMMUNITY): Payer: BC Managed Care – PPO | Admitting: *Deleted

## 2011-08-08 NOTE — Progress Notes (Unsigned)
Patient ID: Charles Hunter, male   DOB: [birth year], 25 y.o.   MRN: [id number]  Met with pts mother at 9AM after she requested a session to discuss her concerns regarding pts progress in program and mental status. Left message with pt that I was meeting with his mother to discuss his attendance and UDSs.

## 2011-08-09 ENCOUNTER — Other Ambulatory Visit (HOSPITAL_COMMUNITY): Payer: BC Managed Care – PPO

## 2011-08-12 ENCOUNTER — Encounter (HOSPITAL_COMMUNITY): Payer: Self-pay | Admitting: Psychology

## 2011-08-12 ENCOUNTER — Other Ambulatory Visit (HOSPITAL_COMMUNITY): Payer: BC Managed Care – PPO

## 2011-08-13 NOTE — Progress Notes (Signed)
Patient ID: Charles Hunter, male   DOB: 19-Sep-1985, 25 y.o.   MRN: [id number] The patient did not appear for group today nor did he phone to explain his absence. The patient has not phoned to explain any of his last 3 consecutive absences and unless I hear differently, the patient will be discharged from the program.

## 2011-08-14 ENCOUNTER — Other Ambulatory Visit (HOSPITAL_COMMUNITY): Payer: BC Managed Care – PPO

## 2011-08-16 ENCOUNTER — Other Ambulatory Visit (HOSPITAL_COMMUNITY): Payer: BC Managed Care – PPO

## 2011-08-19 ENCOUNTER — Other Ambulatory Visit (HOSPITAL_COMMUNITY): Payer: BC Managed Care – PPO

## 2011-08-21 ENCOUNTER — Other Ambulatory Visit (HOSPITAL_COMMUNITY): Payer: BC Managed Care – PPO

## 2011-08-21 ENCOUNTER — Encounter (HOSPITAL_COMMUNITY): Payer: Self-pay | Admitting: Psychology

## 2011-08-21 NOTE — Progress Notes (Signed)
Patient ID: Charles Hunter, male   DOB: 04-Aug-1986, 25 y.o.   MRN: [id number] Patient's mother phoned and reported she had not seen him yesterday. She asked about long term facilities where the residents stay. I provided information about [facility] in [place] and also [facility]. She asked if she could meet with her son? I explained that HB, his primary therapist, was out of the office this week, but would return on Monday. She stated she would like to meet earlier and agreed to come in with [name] at 10 am on Friday and talk about treatment options.

## 2011-08-23 ENCOUNTER — Other Ambulatory Visit (HOSPITAL_COMMUNITY): Payer: BC Managed Care – PPO

## 2011-08-26 ENCOUNTER — Other Ambulatory Visit (HOSPITAL_COMMUNITY): Payer: BC Managed Care – PPO

## 2011-08-28 ENCOUNTER — Other Ambulatory Visit (HOSPITAL_COMMUNITY): Payer: BC Managed Care – PPO | Attending: Physician Assistant

## 2011-08-30 ENCOUNTER — Other Ambulatory Visit (HOSPITAL_COMMUNITY): Payer: BC Managed Care – PPO

## 2011-09-02 ENCOUNTER — Other Ambulatory Visit (HOSPITAL_COMMUNITY): Payer: BC Managed Care – PPO

## 2011-09-04 ENCOUNTER — Other Ambulatory Visit (HOSPITAL_COMMUNITY): Payer: BC Managed Care – PPO

## 2011-10-04 ENCOUNTER — Ambulatory Visit (INDEPENDENT_AMBULATORY_CARE_PROVIDER_SITE_OTHER): Payer: Self-pay | Admitting: Surgery

## 2011-10-04 ENCOUNTER — Encounter (INDEPENDENT_AMBULATORY_CARE_PROVIDER_SITE_OTHER): Payer: Self-pay | Admitting: Surgery

## 2011-10-04 VITALS — BP 110/82 | HR 78 | Temp 97.0°F | Resp 18 | Ht 72.0 in | Wt 179.0 lb

## 2011-10-04 DIAGNOSIS — R109 Unspecified abdominal pain: Secondary | ICD-10-CM

## 2011-10-04 NOTE — Progress Notes (Signed)
Re:   Charles Hunter DOB:   Oct 04, 1985 MRN:   [id number]  ASSESSMENT AND PLAN: 1.  History of bilateral inguinal hernias.  No hernia at this time.  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  I wrote a letter that he can do unlimited physical activity.  This can be found in the Epic Letters section.  I gave him signed copy of the letter.  2.  Smokes.  Knows it is bad for his health.  3.  Dependence issues which he trying to address.  Chief Complaint  Patient presents with  . Incisional Hernia    Needs work release   REFERRING PHYSICIAN: Marga Melnick, MD, MD  HISTORY OF PRESENT ILLNESS: Charles Hunter is a 26 y.o. (DOB: 03-30-1986)  white male whose primary care physician is Marga Melnick, MD, MD and comes to me today for evaluation of a possible hernia and to clear him for work.  He had a prior bilateral laparoscopic hernia repair 04/28/2008  He has occasionally felt some discomfort in his lower abdomen.  And he has noticed an occasional bulge.  He smokes cigarettes which gives him a cough.  His health is otherwise good.    Past Medical History  Diagnosis Date  . Alcohol dependence   . Hypertension       Past Surgical History  Procedure Date  . Hernia repair 08/09      Current Outpatient Prescriptions  Medication Sig Dispense Refill  . mirtazapine (REMERON) 15 MG tablet 1/2 to one tablet by mouth at bedtime as needed for sleep  30 tablet  0     No Known Allergies  REVIEW OF SYSTEMS: Cardiac:  No history of hypertension. No history of heart disease.  No history of prior cardiac catheterization.  No history of seeing a cardiologist. Pulmonary:  Smokes cigarettes.    Gastrointestinal:  No history of stomach disease.  No history of liver disease.  No history of gall bladder disease.  No history of pancreas disease.  No history of colon disease. Urologic:  No history of kidney stones.  No history of bladder  infections. Musculoskeletal:  No history of joint or back disease. Psycho-social:  The patient is oriented.   He has had some alcohol dependence issues.  He uses the Remeron for sleep.  SOCIAL and FAMILY HISTORY: Single.  Accompanied by mother, [name].  PHYSICAL EXAM: BP 110/82  Pulse 78  Temp(Src) 97 F (36.1 C) (Temporal)  Resp 18  Ht 6' (1.829 m)  Wt 179 lb (81.194 kg)  BMI 24.28 kg/m2  General: WN WM, bearded, who is alert and generally healthy appearing.  HEENT: Normal. Pupils equal. Good dentition. Neck: Supple. No mass.  No thyroid mass. Lymph Nodes:  No supraclavicular or cervical nodes. Lungs: Has a cough, though lungs are clear to auscultation and symmetric breath sounds. Heart:  RRR. No murmur or rub.  Abdomen: Soft. No tenderness. No hernia. Normal bowel sounds.  Examined both standing and laying down.  I feel no mass or hernia.  Both groins are intact. Extremities:  Good strength and ROM  in upper and lower extremities. Neurologic:  Grossly intact to motor and sensory function. Psychiatric: Has normal mood and affect. Behavior is normal.   DATA REVIEWED: Old records.  Ovidio Kin, MD,  [facility] [address] [place]  [address]   [place]    [address] Phone:  [phone] FAX:  [phone]

## 2011-10-08 ENCOUNTER — Encounter (INDEPENDENT_AMBULATORY_CARE_PROVIDER_SITE_OTHER): Payer: Self-pay | Admitting: Surgery

## 2012-03-22 ENCOUNTER — Encounter (HOSPITAL_COMMUNITY): Payer: Self-pay | Admitting: Emergency Medicine

## 2012-03-22 ENCOUNTER — Emergency Department (HOSPITAL_COMMUNITY)
Admission: EM | Admit: 2012-03-22 | Discharge: 2012-03-23 | Disposition: A | Discharge status: Home / Self Care | Payer: Self-pay | Attending: Emergency Medicine | Admitting: Emergency Medicine

## 2012-03-22 DIAGNOSIS — I10 Essential (primary) hypertension: Secondary | ICD-10-CM | POA: Insufficient documentation

## 2012-03-22 DIAGNOSIS — F1110 Opioid abuse, uncomplicated: Secondary | ICD-10-CM

## 2012-03-22 DIAGNOSIS — F1010 Alcohol abuse, uncomplicated: Secondary | ICD-10-CM | POA: Insufficient documentation

## 2012-03-22 DIAGNOSIS — F17200 Nicotine dependence, unspecified, uncomplicated: Secondary | ICD-10-CM | POA: Insufficient documentation

## 2012-03-22 LAB — RAPID URINE DRUG SCREEN, HOSP PERFORMED
Barbiturates: NOT DETECTED
Cocaine: NOT DETECTED
Tetrahydrocannabinol: POSITIVE — AB

## 2012-03-22 NOTE — ED Notes (Signed)
Pt alert, arrives from home with Crisis Counselor for Detox from Heroin, last use prior to arrival, resp even unlabored, skin pwd, denies SI/HI

## 2012-03-23 LAB — COMPREHENSIVE METABOLIC PANEL
ALT: 17 U/L (ref 0–53)
AST: 16 U/L (ref 0–37)
CO2: 26 mEq/L (ref 19–32)
Calcium: 9.4 mg/dL (ref 8.4–10.5)
Chloride: 103 mEq/L (ref 96–112)
GFR calc Af Amer: >90 mL/min (ref >90)
GFR calc non Af Amer: >90 mL/min (ref >90)
Glucose, Bld: 89 mg/dL (ref 70–99)
Sodium: 141 mEq/L (ref 135–145)
Total Bilirubin: 0.2 mg/dL — ABNORMAL LOW (ref 0.3–1.2)

## 2012-03-23 LAB — CBC
Hemoglobin: 14.6 g/dL (ref 13.0–17.0)
MCH: 30.9 pg (ref 26.0–34.0)
MCV: 90.3 fL (ref 78.0–100.0)
Platelets: 279 10*3/uL (ref 150–400)
RBC: 4.72 MIL/uL (ref 4.22–5.81)
WBC: 12 10*3/uL — ABNORMAL HIGH (ref 4.0–10.5)

## 2012-03-23 MED ORDER — ACETAMINOPHEN 325 MG PO TABS: 650.0000 mg | ORAL_TABLET | ORAL | Status: DC | PRN

## 2012-03-23 MED ORDER — CLONIDINE HCL 0.1 MG PO TABS: 0.1000 mg | ORAL_TABLET | Freq: Every day | ORAL | Status: DC

## 2012-03-23 MED ORDER — ALUM & MAG HYDROXIDE-SIMETH 200-200-20 MG/5ML PO SUSP: 30.0000 mL | ORAL | Status: DC | PRN

## 2012-03-23 MED ORDER — NAPROXEN 500 MG PO TABS
500.0000 mg | ORAL_TABLET | Freq: Two times a day (BID) | ORAL | Status: DC | PRN
  Administered 2012-03-23: 500 mg via ORAL
  Filled 2012-03-23: qty 1

## 2012-03-23 MED ORDER — ONDANSETRON 4 MG PO TBDP
4.0000 mg | ORAL_TABLET | Freq: Four times a day (QID) | ORAL | Status: DC | PRN
  Administered 2012-03-23: 4 mg via ORAL
  Filled 2012-03-23: qty 1

## 2012-03-23 MED ORDER — CLONIDINE HCL 0.1 MG PO TABS: 0.1000 mg | ORAL_TABLET | ORAL | Status: DC

## 2012-03-23 MED ORDER — IBUPROFEN 200 MG PO TABS: 600.0000 mg | ORAL_TABLET | Freq: Three times a day (TID) | ORAL | Status: DC | PRN

## 2012-03-23 MED ORDER — NICOTINE 21 MG/24HR TD PT24
21.0000 mg | MEDICATED_PATCH | Freq: Every day | TRANSDERMAL | Status: DC
  Administered 2012-03-23: 21 mg via TRANSDERMAL
  Filled 2012-03-23: qty 1

## 2012-03-23 MED ORDER — CLONIDINE HCL 0.1 MG PO TABS
0.1000 mg | ORAL_TABLET | Freq: Four times a day (QID) | ORAL | Status: DC
  Administered 2012-03-23: 0.1 mg via ORAL
  Filled 2012-03-23: qty 1

## 2012-03-23 MED ORDER — LOPERAMIDE HCL 2 MG PO CAPS
2.0000 mg | ORAL_CAPSULE | ORAL | Status: DC | PRN
  Administered 2012-03-23: 4 mg via ORAL
  Filled 2012-03-23: qty 2

## 2012-03-23 MED ORDER — DICYCLOMINE HCL 20 MG PO TABS
20.0000 mg | ORAL_TABLET | Freq: Four times a day (QID) | ORAL | Status: DC | PRN
  Administered 2012-03-23: 20 mg via ORAL
  Filled 2012-03-23: qty 1

## 2012-03-23 MED ORDER — METHOCARBAMOL 500 MG PO TABS
500.0000 mg | ORAL_TABLET | Freq: Three times a day (TID) | ORAL | Status: DC | PRN
  Filled 2012-03-23: qty 1

## 2012-03-23 MED ORDER — HYDROXYZINE HCL 25 MG PO TABS
25.0000 mg | ORAL_TABLET | Freq: Four times a day (QID) | ORAL | Status: DC | PRN
  Administered 2012-03-23: 25 mg via ORAL
  Filled 2012-03-23: qty 1

## 2012-03-23 NOTE — ED Notes (Signed)
[id number]<AV> Expected date:<BR> Expected time:<BR> Means of arrival:<BR> Comments:<BR> TRIAGE

## 2012-03-23 NOTE — ED Provider Notes (Addendum)
11 AM patient alert pleasant operative plan he will go directly from here to [facility] for detox from heroin patient is ambulatory pleasant cooperative Glasgow Coma Score 15. Results for orders placed during the hospital encounter of 03/22/12  CBC      Component Value Range   WBC 12.0 (*) 4.0 - 10.5 K/uL   RBC 4.72  4.22 - 5.81 MIL/uL   Hemoglobin 14.6  13.0 - 17.0 g/dL   HCT 40.9  81.1 - 91.4 %   MCV 90.3  78.0 - 100.0 fL   MCH 30.9  26.0 - 34.0 pg   MCHC 34.3  30.0 - 36.0 g/dL   RDW 78.2  95.6 - 21.3 %   Platelets 279  150 - 400 K/uL  COMPREHENSIVE METABOLIC PANEL      Component Value Range   Sodium 141  135 - 145 mEq/L   Potassium 4.2  3.5 - 5.1 mEq/L   Chloride 103  96 - 112 mEq/L   CO2 26  19 - 32 mEq/L   Glucose, Bld 89  70 - 99 mg/dL   BUN 11  6 - 23 mg/dL   Creatinine, Ser 0.86  0.50 - 1.35 mg/dL   Calcium 9.4  8.4 - 57.8 mg/dL   Total Protein 6.8  6.0 - 8.3 g/dL   Albumin 3.7  3.5 - 5.2 g/dL   AST 16  0 - 37 U/L   ALT 17  0 - 53 U/L   Alkaline Phosphatase 101  39 - 117 U/L   Total Bilirubin 0.2 (*) 0.3 - 1.2 mg/dL   GFR calc non Af Amer >90  >90 mL/min   GFR calc Af Amer >90  >90 mL/min  ETHANOL      Component Value Range   Alcohol, Ethyl (B) <11  0 - 11 mg/dL  URINE RAPID DRUG SCREEN (HOSP PERFORMED)      Component Value Range   Opiates POSITIVE (*) NONE DETECTED   Cocaine NONE DETECTED  NONE DETECTED   Benzodiazepines NONE DETECTED  NONE DETECTED   Amphetamines NONE DETECTED  NONE DETECTED   Tetrahydrocannabinol POSITIVE (*) NONE DETECTED   Barbiturates NONE DETECTED  NONE DETECTED   No results found.   Doug Sou, MD 03/23/12 1100  Doug Sou, MD 03/23/12 1728

## 2012-03-23 NOTE — [facility] Counselor (Signed)
Per mobile crises worker, patient accepted to [facility] for detox.

## 2012-03-23 NOTE — ED Provider Notes (Signed)
History     CSN: [id number] Arrival date & time 03/22/12  2321 First MD Initiated Contact with Patient 03/23/12 0127     Chief Complaint  Patient presents with  . Medical Clearance  . 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  Pt last used this evening.  Pt was seen by [facility] from [place] [facility].  Pt was trying to get into a program at [facility] but was not able to.  Pt feels anxious and numb.  No vomiting or diarrhea.  He wants to get off the drug. No Si or HI.  Past Medical History  Diagnosis Date  . Alcohol dependence   . Hypertension     Past Surgical History  Procedure Date  . Hernia repair 08/09    No family history on file.  History  Substance Use Topics  . Smoking status: Current Everyday Smoker -- 0.5 packs/day  . Smokeless tobacco: Not on file  . Alcohol Use: 84.0 oz/week    140 Cans of beer per week     occasionally      Review of Systems  All other systems reviewed and are negative.    Allergies  Review of patient's allergies indicates no known allergies.  Home Medications  No current outpatient prescriptions on file.  BP 140/88  Pulse 100  Temp 98.4 F (36.9 C) (Oral)  Resp 20  Ht 6' (1.829 m)  Wt 170 lb (77.111 kg)  BMI 23.06 kg/m2  SpO2 100%  Physical Exam  Nursing note and vitals reviewed. Constitutional: He appears well-developed and well-nourished. No distress.  HENT:  Head: Normocephalic and atraumatic.  Right Ear: External ear normal.  Left Ear: External ear normal.  Eyes: Conjunctivae are normal. Right eye exhibits no discharge. Left eye exhibits no discharge. No scleral icterus.  Neck: Neck supple. No tracheal deviation present.  Cardiovascular: Normal rate, regular rhythm and intact distal pulses.   Pulmonary/Chest: Effort normal and breath sounds normal. No stridor. No respiratory distress. He has no wheezes. He has no rales.  Abdominal: Soft. Bowel sounds are normal. He  exhibits no distension. There is no tenderness. There is no rebound and no guarding.  Musculoskeletal: He exhibits no edema and no tenderness.  Neurological: He is alert. He has normal strength. No sensory deficit. Cranial nerve deficit:  no gross defecits noted. He exhibits normal muscle tone. He displays no seizure activity. Coordination normal.  Skin: Skin is warm and dry. No rash noted.  Psychiatric: He has a normal mood and affect. His behavior is normal. Thought content normal.    ED Course  Procedures (including critical care time)  Labs Reviewed  CBC - Abnormal; Notable for the following:    WBC 12.0 (*)     All other components within normal limits  COMPREHENSIVE METABOLIC PANEL - Abnormal; Notable for the following:    Total Bilirubin 0.2 (*)     All other components within normal limits  URINE RAPID DRUG SCREEN (HOSP PERFORMED) - Abnormal; Notable for the following:    Opiates POSITIVE (*)     Tetrahydrocannabinol POSITIVE (*)     All other components within normal limits  ETHANOL   No results found.    MDM  Pt medically stable.  Awaiting placement at a treatment center.        Celene Kras, MD 03/23/12 [phone]

## 2012-03-23 NOTE — ED Notes (Signed)
Per Dawn pt may be placed [facility], will know in AM

## 2012-03-23 NOTE — ED Notes (Signed)
Pt belongings transferred to locker 33 in [employer]

## 2012-03-23 NOTE — ED Notes (Signed)
Assumed care of pt.  Pt states he is here for detox from heroin.  Last used yesterday.  Denies SI/HI.

## 2012-03-23 NOTE — ED Notes (Signed)
Pt moved to room 6 and belongings locked and secured in locker 3 in [place]

## 2013-05-14 ENCOUNTER — Emergency Department (HOSPITAL_COMMUNITY)
Admission: EM | Admit: 2013-05-14 | Discharge: 2013-05-14 | Disposition: A | Discharge status: Home / Self Care | Payer: Self-pay

## 2013-05-14 NOTE — ED Notes (Signed)
Called for triage no response 

## 2013-05-25 ENCOUNTER — Emergency Department (HOSPITAL_BASED_OUTPATIENT_CLINIC_OR_DEPARTMENT_OTHER)
Admission: EM | Admit: 2013-05-25 | Discharge: 2013-05-25 | Disposition: A | Discharge status: Home / Self Care | Payer: Self-pay | Attending: Emergency Medicine | Admitting: Emergency Medicine

## 2013-05-25 ENCOUNTER — Encounter (HOSPITAL_BASED_OUTPATIENT_CLINIC_OR_DEPARTMENT_OTHER): Payer: Self-pay | Admitting: *Deleted

## 2013-05-25 DIAGNOSIS — F1410 Cocaine abuse, uncomplicated: Secondary | ICD-10-CM | POA: Insufficient documentation

## 2013-05-25 DIAGNOSIS — F1310 Sedative, hypnotic or anxiolytic abuse, uncomplicated: Secondary | ICD-10-CM | POA: Insufficient documentation

## 2013-05-25 DIAGNOSIS — F1910 Other psychoactive substance abuse, uncomplicated: Secondary | ICD-10-CM

## 2013-05-25 DIAGNOSIS — F17200 Nicotine dependence, unspecified, uncomplicated: Secondary | ICD-10-CM | POA: Insufficient documentation

## 2013-05-25 DIAGNOSIS — F1010 Alcohol abuse, uncomplicated: Secondary | ICD-10-CM | POA: Insufficient documentation

## 2013-05-25 DIAGNOSIS — I10 Essential (primary) hypertension: Secondary | ICD-10-CM | POA: Insufficient documentation

## 2013-05-25 LAB — RAPID URINE DRUG SCREEN, HOSP PERFORMED
Amphetamines: NOT DETECTED
Barbiturates: NOT DETECTED
Benzodiazepines: NOT DETECTED
Cocaine: NOT DETECTED
Opiates: POSITIVE — AB
Tetrahydrocannabinol: POSITIVE — AB

## 2013-05-25 LAB — CBC WITH DIFFERENTIAL/PLATELET
Eosinophils Absolute: 0.1 10*3/uL (ref 0.0–0.7)
Hemoglobin: 14.2 g/dL (ref 13.0–17.0)
Lymphocytes Relative: 40 % (ref 12–46)
Lymphs Abs: 2.8 10*3/uL (ref 0.7–4.0)
MCH: 29.8 pg (ref 26.0–34.0)
Monocytes Relative: 9 % (ref 3–12)
Neutro Abs: 3.4 10*3/uL (ref 1.7–7.7)
Neutrophils Relative %: 49 % (ref 43–77)
RBC: 4.76 MIL/uL (ref 4.22–5.81)
WBC: 6.9 10*3/uL (ref 4.0–10.5)

## 2013-05-25 LAB — BASIC METABOLIC PANEL
BUN: 12 mg/dL (ref 6–23)
CO2: 27 mEq/L (ref 19–32)
Chloride: 101 mEq/L (ref 96–112)
Glucose, Bld: 99 mg/dL (ref 70–99)
Potassium: 4 mEq/L (ref 3.5–5.1)
Sodium: 139 mEq/L (ref 135–145)

## 2013-05-25 NOTE — ED Notes (Signed)
Medical clearance. States he wants detox from heroin. Last used this am.

## 2013-05-25 NOTE — ED Provider Notes (Signed)
CSN: [id number]     Arrival date & time 05/25/13  1747 History   First MD Initiated Contact with Patient 05/25/13 1800     Chief Complaint  Patient presents with  . Medical Clearance   (Consider location/radiation/quality/duration/timing/severity/associated sxs/prior Treatment) HPI Pt presents with [facility] requesting detox from heroin.  He has been accepted at [facility] and needs medical clearance.  He last used heroin this morning.  He has no vomiting or diarrhea.  Denies SI/HI ideations.  Denies using other substances.  Denies recent illness, no fever/cough, abdominal pain or other symptoms.  There are no other associated systemic symptoms, there are no other alleviating or modifying factors.   Past Medical History  Diagnosis Date  . Alcohol dependence   . Hypertension    Past Surgical History  Procedure Laterality Date  . Hernia repair  08/09   No family history on file. History  Substance Use Topics  . Smoking status: Current Every Day Smoker -- 0.50 packs/day  . Smokeless tobacco: Not on file  . Alcohol Use: 84.0 oz/week    140 Cans of beer per week     Comment: occasionally    Review of Systems ROS reviewed and all otherwise negative except for mentioned in HPI  Allergies  Review of patient's allergies indicates no known allergies.  Home Medications  No current outpatient prescriptions on file. BP 127/84  Pulse 68  Temp(Src) 99.2 F (37.3 C) (Oral)  Resp 16  Wt 170 lb (77.111 kg)  BMI 23.05 kg/m2  SpO2 100% Vitals reviewed Physical Exam Physical Examination: General appearance - alert, well appearing, and in no distress Mental status - alert, oriented to person, place, and time Eyes - pupils equal and reactive, extraocular eye movements intact Mouth - mucous membranes moist, pharynx normal without lesions Chest - clear to auscultation, no wheezes, rales or rhonchi, symmetric air entry Heart - normal rate, regular rhythm, normal S1, S2, no murmurs, rubs,  clicks or gallops Abdomen - soft, nontender, nondistended, no masses or organomegaly Extremities - peripheral pulses normal, no pedal edema, no clubbing or cyanosis Skin - normal coloration and turgor, no rashes Psych- normal mood and affect  ED Course  Procedures (including critical care time)  7:05 PM labs are resulted and patient is now medically cleared.  I have talked with [facility] and she is faxing over the results now.   Labs Review Labs Reviewed  URINE RAPID DRUG SCREEN (HOSP PERFORMED) - Abnormal; Notable for the following:    Opiates POSITIVE (*)    Tetrahydrocannabinol POSITIVE (*)    All other components within normal limits  CBC WITH DIFFERENTIAL  BASIC METABOLIC PANEL  ETHANOL   Imaging Review No results found.  MDM   1. Substance abuse    Pt presenting for detox from heroin.  He is medically cleared and has been accepted at [facility].  Pt discharged in stable condition   Ethelda Chick, MD 05/25/13 1944

## 2013-12-22 ENCOUNTER — Emergency Department (HOSPITAL_COMMUNITY)
Admission: EM | Admit: 2013-12-22 | Discharge: 2013-12-22 | Disposition: A | Discharge status: Home / Self Care | Payer: BC Managed Care – PPO | Attending: Emergency Medicine | Admitting: Emergency Medicine

## 2013-12-22 ENCOUNTER — Encounter (HOSPITAL_COMMUNITY): Payer: Self-pay | Admitting: Emergency Medicine

## 2013-12-22 DIAGNOSIS — F10229 Alcohol dependence with intoxication, unspecified: Secondary | ICD-10-CM | POA: Insufficient documentation

## 2013-12-22 DIAGNOSIS — Z0000 Encounter for general adult medical examination without abnormal findings: Secondary | ICD-10-CM

## 2013-12-22 DIAGNOSIS — F1910 Other psychoactive substance abuse, uncomplicated: Secondary | ICD-10-CM | POA: Insufficient documentation

## 2013-12-22 DIAGNOSIS — I10 Essential (primary) hypertension: Secondary | ICD-10-CM | POA: Insufficient documentation

## 2013-12-22 DIAGNOSIS — Z8619 Personal history of other infectious and parasitic diseases: Secondary | ICD-10-CM | POA: Insufficient documentation

## 2013-12-22 DIAGNOSIS — Z008 Encounter for other general examination: Secondary | ICD-10-CM | POA: Insufficient documentation

## 2013-12-22 DIAGNOSIS — F17200 Nicotine dependence, unspecified, uncomplicated: Secondary | ICD-10-CM | POA: Insufficient documentation

## 2013-12-22 HISTORY — DX: Unspecified viral hepatitis C without hepatic coma: B19.20

## 2013-12-22 HISTORY — DX: Other psychoactive substance abuse, uncomplicated: F19.10

## 2013-12-22 LAB — HEPATITIS PANEL, ACUTE
HCV Ab: NEGATIVE
HEP B S AG: NEGATIVE
Hep A IgM: NONREACTIVE
Hep B C IgM: NONREACTIVE

## 2013-12-22 LAB — CBC
HEMATOCRIT: 41.6 % (ref 39.0–52.0)
HEMOGLOBIN: 14.6 g/dL (ref 13.0–17.0)
MCH: 30.8 pg (ref 26.0–34.0)
MCHC: 35.1 g/dL (ref 30.0–36.0)
MCV: 87.8 fL (ref 78.0–100.0)
PLATELETS: 206 10*3/uL (ref 150–400)
RBC: 4.74 MIL/uL (ref 4.22–5.81)
RDW: 12.7 % (ref 11.5–15.5)
WBC: 6.5 10*3/uL (ref 4.0–10.5)

## 2013-12-22 LAB — URINALYSIS, ROUTINE W REFLEX MICROSCOPIC
Bilirubin Urine: NEGATIVE
Glucose, UA: NEGATIVE mg/dL
HGB URINE DIPSTICK: NEGATIVE
Ketones, ur: NEGATIVE mg/dL
Leukocytes, UA: NEGATIVE
Nitrite: NEGATIVE
PROTEIN: NEGATIVE mg/dL
Specific Gravity, Urine: 1.026 (ref 1.005–1.030)
UROBILINOGEN UA: 1 mg/dL (ref 0.0–1.0)
pH: 6 (ref 5.0–8.0)

## 2013-12-22 LAB — COMPREHENSIVE METABOLIC PANEL
ALT: 22 U/L (ref 0–53)
AST: 18 U/L (ref 0–37)
Albumin: 3.8 g/dL (ref 3.5–5.2)
Alkaline Phosphatase: 103 U/L (ref 39–117)
BILIRUBIN TOTAL: 0.2 mg/dL — AB (ref 0.3–1.2)
BUN: 11 mg/dL (ref 6–23)
CHLORIDE: 102 meq/L (ref 96–112)
CO2: 24 meq/L (ref 19–32)
CREATININE: 0.72 mg/dL (ref 0.50–1.35)
Calcium: 9.1 mg/dL (ref 8.4–10.5)
GFR calc Af Amer: >90 mL/min (ref >90)
Glucose, Bld: 125 mg/dL — ABNORMAL HIGH (ref 70–99)
Potassium: 4.2 mEq/L (ref 3.7–5.3)
Sodium: 139 mEq/L (ref 137–147)
Total Protein: 7.1 g/dL (ref 6.0–8.3)

## 2013-12-22 NOTE — Discharge Instructions (Signed)
Normal Exam, Adult You were seen and examined today in our facility. Our caregiver found nothing wrong on the exam. If testing was done such as lab work or x-rays, they did not indicate enough wrong to suggest that treatment should be given. The caregiver then must decide after testing is finished if your concern is a physical problem or illness that needs treatment. Today no treatable problem was found. Even if reassurance was given, if you feel you are getting worse when you get home make sure you call back or return to our department. For the protection of your privacy, test results can not be given over the phone. Make sure you receive the results of your test. Ask as to how these results are to be obtained if you have not been informed. It is your responsibility to obtain your test results. Your condition can change over time. Sometimes it takes more than one visit to determine the cause of your symptoms. It is important that you monitor your condition for any changes. SEEK IMMEDIATE MEDICAL CARE IF:  You develop an oral temperature above 102 F (38.9 C), which lasts for more than 2 days, not controlled by medications. Only take over-the-counter or prescription medicines for pain, discomfort, or fever as directed by your caregiver.  You develop a loss of appetite or start throwing up (vomiting).  You develop a rash, cough, belly (abdominal) pain, earache, headache, or develop pain in neck, muscles, or joints.  The problem or problems which brought you to our facility become worse or are a cause of more concern. If we have told you today your exam and tests are normal, and a short while later you feel this is not right, please seek medical attention so you may be rechecked. Document Released: 11/24/2000 Document Revised: 11/04/2011 Document Reviewed: 03/18/2008 [facility] Patient Information 2014 [employer], [place].  Medical Screening Exam A medical screening exam has been done. This exam helps  find the cause of your problem and determines whether you need emergency treatment. Your exam has shown that you do not need emergency treatment at this point. It is safe for you to go to your caregiver's office or clinic for treatment. You should make an appointment today to see your caregiver as soon as he or she is available. Depending on your illness, your symptoms and condition can change over time. If your condition gets worse or you develop new or troubling symptoms before you see your caregiver, you should return to the emergency department for further evaluation.  Document Released: 09/19/2004 Document Revised: 11/04/2011 Document Reviewed: 05/01/2011 [facility] Patient Information 2014 [place], [place].

## 2013-12-22 NOTE — ED Notes (Signed)
Pt states recently diagnosed with Hep C.  Going to a long term facility.  Wants to make sure he is medically cleared and does not need treatment for his admission to facility.  No specific complaints.  States urine is darkened

## 2013-12-22 NOTE — ED Provider Notes (Signed)
CSN: [id number]     Arrival date & time 12/22/13  1152 History  This chart was scribed for non-physician practitioner working with Charles Hunter, by Tana ConchStephen Methvin ED Scribe. This patient was seen in [facility] and the patient's care was started at 12:26 PM.    Chief Complaint  Patient presents with  . Hepatitis C    Pt wanting medical clearance for his long term facility      The history is provided by the patient and a parent. No language interpreter was used.    HPI Comments: Charles Hunter is a 28 y.o. male who presents to the Emergency Department wanting to be cleared to a long term medical facility that does not have medical staff, he has recently been diagnosed with Hep C and wants to get everything in order prior to entering the facility. He wants to make sure his liver is functioning ok. The facility is therapeutic , he has already been through detox. He is supposed to enter the facility today. He denies abdominal pain, fever, n/v/d, and chills. States his urine has been dark. Denies increased urinary frequency, urgency or dysuria.  Pt is former IV drug user.  PCP Dr Alwyn RenHopper.  Charles Hunter is pt's mother. [phone]. Mother wants to be authorized to receive the test results as the pt will not be available while at the facility.   Past Medical History  Diagnosis Date  . Alcohol dependence   . Hypertension   . Hepatitis C   . Substance abuse    Past Surgical History  Procedure Laterality Date  . Hernia repair  08/09   History reviewed. No pertinent family history. History  Substance Use Topics  . Smoking status: Current Every Day Smoker -- 0.50 packs/day  . Smokeless tobacco: Not on file  . Alcohol Use: 84.0 oz/week    140 Cans of beer per week     Comment: occasionally    Review of Systems  Constitutional: Negative for fever and chills.  Gastrointestinal: Negative for nausea, vomiting, abdominal pain and diarrhea.  Psychiatric/Behavioral:  Negative for confusion.  All other systems reviewed and are negative.     Allergies  Review of patient's allergies indicates no known allergies.  Home Medications   Prior to Admission medications   Not on File   BP 124/82  Pulse 102  Temp(Src) 98.3 F (36.8 C) (Oral)  Resp 16  SpO2 100% Physical Exam  Nursing note and vitals reviewed. Constitutional: He is oriented to person, place, and time. He appears well-developed and well-nourished. No distress.  HENT:  Head: Normocephalic and atraumatic.  Eyes: Conjunctivae and EOM are normal.  Neck: Normal range of motion. Neck supple.  Cardiovascular: Normal rate, regular rhythm and normal heart sounds.   Pulmonary/Chest: Effort normal and breath sounds normal.  Abdominal: Soft. Bowel sounds are normal. He exhibits no distension. There is no hepatomegaly. There is no tenderness.  No hepatomegaly   Musculoskeletal: Normal range of motion. He exhibits no edema.  Neurological: He is alert and oriented to person, place, and time.  Skin: Skin is warm and dry.  Psychiatric: He has a normal mood and affect. His behavior is normal.    ED Course  Procedures (including critical care time)  DIAGNOSTIC STUDIES: Oxygen Saturation is 100% on RA, normal by my interpretation.    COORDINATION OF CARE:   12:33 PM-Discussed treatment plan which includes labs with pt at bedside and pt agreed to plan.   Labs Review Labs Reviewed  COMPREHENSIVE METABOLIC PANEL - Abnormal; Notable for the following:    Glucose, Bld 125 (*)    Total Bilirubin 0.2 (*)    All other components within normal limits  CBC  URINALYSIS, ROUTINE W REFLEX MICROSCOPIC  HEPATITIS PANEL, ACUTE    Imaging Review No results found.   EKG Interpretation None      MDM   Final diagnoses:  Normal physical exam   Patient presenting for evaluation of recent diagnosis of hepatitis C. He is asymptomatic. He is requesting a hepatitis panel. He has largely detoxed,  treatment is therapeutic. CBC, CMP normal. Urinalysis clear. Hepatitis panel pending. He has a primary care physician Dr. Alwyn RenHopper. Vital signs stable, no tachycardia on my exam. Stable for discharge. Return precautions given. Patient states understanding of treatment care plan and is agreeable.   I personally performed the services described in this documentation, which was scribed in my presence. The recorded information has been reviewed and is accurate.    Trevor MaceRobyn M Albert, PA-C 12/22/13 1321

## 2013-12-23 NOTE — ED Provider Notes (Signed)
History/physical exam/procedure(s) were performed by non-physician practitioner and as supervising physician I was immediately available for consultation/collaboration. I have reviewed all notes and am in agreement with care and plan.   Jazz Rogala S Ipek Westra, MD 12/23/13 0839 

## 2014-11-07 ENCOUNTER — Other Ambulatory Visit: Payer: BLUE CROSS/BLUE SHIELD

## 2014-11-07 ENCOUNTER — Encounter: Payer: Self-pay | Admitting: Family

## 2014-11-07 ENCOUNTER — Ambulatory Visit (INDEPENDENT_AMBULATORY_CARE_PROVIDER_SITE_OTHER): Payer: BLUE CROSS/BLUE SHIELD | Admitting: Family

## 2014-11-07 VITALS — BP 110/80 | HR 64 | Temp 98.2°F | Resp 18 | Ht 72.0 in | Wt 183.1 lb

## 2014-11-07 DIAGNOSIS — K1379 Other lesions of oral mucosa: Secondary | ICD-10-CM

## 2014-11-07 DIAGNOSIS — R21 Rash and other nonspecific skin eruption: Secondary | ICD-10-CM

## 2014-11-07 NOTE — Patient Instructions (Addendum)
Thank you for choosing [facility].  Summary/Instructions:  Recommend oral anesethsia agent like ambesol as needed.  Please stop by the lab on the basement level of the building for your blood work. Your results will be released to MyChart (or called to you) after review, usually within 72 hours after test completion. If any changes need to be made, you will be notified at that same time.  If your symptoms worsen or fail to improve, please contact our office for further instruction, or in case of emergency go directly to the emergency room at the closest medical facility.   Cold Sore A cold sore (fever blister) is a skin infection caused by the herpes simplex virus (HSV-1). HSV-1 is closely related to the virus that causes genital herpes (HSV-2), but they are not the same even though both viruses can cause oral and genital infections. Cold sores are small, fluid-filled sores inside of the mouth or on the lips, gums, nose, chin, cheeks, or fingers.  The herpes simplex virus can be easily passed (contagious) to other people through close personal contact, such as kissing or sharing personal items. The virus can also spread to other parts of the body, such as the eyes or genitals. Cold sores are contagious until the sores crust over completely. They often heal within 2 weeks.  Once a person is infected, the herpes simplex virus remains permanently in the body. Therefore, there is no cure for cold sores, and they often recur when a person is tired, stressed, sick, or gets too much sun. Additional factors that can cause a recurrence include hormone changes in menstruation or pregnancy, certain drugs, and cold weather.  CAUSES  Cold sores are caused by the herpes simplex virus. The virus is spread from person to person through close contact, such as through kissing, touching the affected area, or sharing personal items such as lip balm, razors, or eating utensils.  SYMPTOMS  The first infection may  not cause symptoms. If symptoms develop, the symptoms often go through different stages. Here is how a cold sore develops:   Tingling, itching, or burning is felt 1-2 days before the outbreak.   Fluid-filled blisters appear on the lips, inside the mouth, nose, or on the cheeks.   The blisters start to ooze clear fluid.   The blisters dry up and a yellow crust appears in its place.   The crust falls off.  Symptoms depend on whether it is the initial outbreak or a recurrence. Some other symptoms with the first outbreak may include:   Fever.   Sore throat.   Headache.   Muscle aches.   Swollen neck glands.  DIAGNOSIS  A diagnosis is often made based on your symptoms and looking at the sores. Sometimes, a sore may be swabbed and then examined in the lab to make a final diagnosis. If the sores are not present, blood tests can find the herpes simplex virus.  TREATMENT  There is no cure for cold sores and no vaccine for the herpes simplex virus. Within 2 weeks, most cold sores go away on their own without treatment. Medicines cannot make the infection go away, but medicine can help relieve some of the pain associated with the sores, can work to stop the virus from multiplying, and can also shorten healing time. Medicine may be in the form of creams, gels, pills, or a shot.  HOME CARE INSTRUCTIONS   Only take over-the-counter or prescription medicines for pain, discomfort, or fever as directed by  your caregiver. Do not use aspirin.   Use a cotton-tip swab to apply creams or gels to your sores.   Do not touch the sores or pick the scabs. Wash your hands often. Do not touch your eyes without washing your hands first.   Avoid kissing, oral sex, and sharing personal items until sores heal.   Apply an ice pack on your sores for 10-15 minutes to ease any discomfort.   Avoid hot, cold, or salty foods because they may hurt your mouth. Eat a soft, bland diet to avoid irritating  the sores. Use a straw to drink if you have pain when drinking out of a glass.   Keep sores clean and dry to prevent an infection of other tissues.   Avoid the sun and limit stress if these things trigger outbreaks. If sun causes cold sores, apply sunscreen on the lips before being out in the sun.  SEEK MEDICAL CARE IF:   You have a fever or persistent symptoms for more than 2-3 days.   You have a fever and your symptoms suddenly get worse.   You have pus, not clear fluid, coming from the sores.   You have redness that is spreading.   You have pain or irritation in your eye.   You get sores on your genitals.   Your sores do not heal within 2 weeks.   You have a weakened immune system.   You have frequent recurrences of cold sores.  MAKE SURE YOU:   Understand these instructions.  Will watch your condition.  Will get help right away if you are not doing well or get worse. Document Released: 08/09/2000 Document Revised: 12/27/2013 Document Reviewed: 12/25/2011 [facility] Patient Information 2015 [place], [place]. This information is not intended to replace advice given to you by your health care provider. Make sure you discuss any questions you have with your health care provider.

## 2014-11-07 NOTE — Assessment & Plan Note (Signed)
Symptoms and exam of rash consistent with potential acne. Continue to monitor at this time. No evidence of infection or inflammation present. Follow-up if symptoms worsen or fail to improve.

## 2014-11-07 NOTE — Assessment & Plan Note (Signed)
Symptoms and exam consistent with oral canker sores. Obtain HSV 1 IgG. Obtain HIV per patient request. Continue over the counter medication as needed for symptoms relief pending lab work. Follow-up with recurrence or if symptoms worsen.

## 2014-11-07 NOTE — Assessment & Plan Note (Deleted)
Symptoms and exam consistent with oral canker sores, however cannot rule out HSV 1. Obtain HSV 1 IgG. Obtain HIV per patient request. Follow-up with recurrence or if symptoms worsen.

## 2014-11-07 NOTE — Progress Notes (Signed)
Pre visit review using our clinic review tool, if applicable. No additional management support is needed unless otherwise documented below in the visit note. 

## 2014-11-07 NOTE — Progress Notes (Signed)
Subjective:    Patient ID: Charles Hunter, male    DOB: 1986/03/23, 29 y.o.   MRN: [id number]  Chief Complaint  Patient presents with  . Establish Care    says he had an infection on his finger and on his back that was like an abscess, says that he thinks he got it again, wants to know if there is any tests that can be ran to see what it is, bc they have recently been occuring every 2 or so weeks    HPI:  Charles Hunter is a 29 y.o. male who presents today to establish care and discuss a skin infection.  1) Rash - Associated symptoms of rash that is described as a pimple/abscess which are small, raised and occasional drainage which has been going on since November. Notes that he waxed and waned and since getting the antibiotics the frequency has been less.  Modifying factors include antibiotic that he received on a visit to the ED.  Pain at worst is a 5/10.  Currently experiencing a sore in his mouth that he is concerned is related the spots on his back.   No Known Allergies   No current outpatient prescriptions on file prior to visit.   No current facility-administered medications on file prior to visit.    Past Medical History  Diagnosis Date  . Alcohol dependence   . Hypertension   . Hepatitis C   . Substance abuse   . Chicken pox     Past Surgical History  Procedure Laterality Date  . Hernia repair  08/09    Family History  Problem Relation Age of Onset  . Healthy Mother   . Healthy Maternal Grandmother   . Healthy Maternal Grandfather   . Healthy Paternal Grandmother   . Healthy Paternal Grandfather     History   Social History  . Marital Status: Single    Spouse Name: N/A  . Number of Children: 0  . Years of Education: 12   Occupational History  . Driver    Social History Main Topics  . Smoking status: Former Smoker -- 0.50 packs/day    Quit date: 04/07/2014  . Smokeless tobacco: Never Used  . Alcohol Use: No     Comment: Sober for  almost a year  . Drug Use: No     Comment: herion  . Sexual Activity: Not on file   Other Topics Concern  . Not on file   Social History Narrative   Fun: Read, go outside, exercise   Denies religious beliefs that effect health care.     Review of Systems  Constitutional: Negative for fever and chills.  Skin: Positive for rash.      Objective:    BP 110/80 mmHg  Pulse 64  Temp(Src) 98.2 F (36.8 C) (Oral)  Resp 18  Ht 6' (1.829 m)  Wt 183 lb 1.9 oz (83.063 kg)  BMI 24.83 kg/m2  SpO2 99% Nursing note and vital signs reviewed.  Physical Exam  Constitutional: He is oriented to person, place, and time. He appears well-developed and well-nourished. No distress.  Cardiovascular: Normal rate, regular rhythm, normal heart sounds and intact distal pulses.   Pulmonary/Chest: Effort normal and breath sounds normal.  Neurological: He is alert and oriented to person, place, and time.  Skin: Skin is warm and dry.  Back - small comedones, no organized pattern, no redness, swelling or discharge.  Mouth - small  white colored oval with red base appears  healing. Well demarcated borders.  Psychiatric: He has a normal mood and affect. His behavior is normal. Judgment and thought content normal.       Assessment & Plan:

## 2014-11-08 ENCOUNTER — Telehealth: Payer: Self-pay | Admitting: Family

## 2014-11-08 LAB — HSV(HERPES SIMPLEX VRS) I + II AB-IGG: HSV 2 Glycoprotein G Ab, IgG: <0.1 IV

## 2014-11-08 LAB — HSV 2 ANTIBODY, IGG

## 2014-11-08 LAB — HIV ANTIBODY (ROUTINE TESTING W REFLEX): HIV 1&2 Ab, 4th Generation: NONREACTIVE

## 2014-11-08 NOTE — Telephone Encounter (Signed)
Please inform the patient that his lab work for HIV and HSV were all negative. Therefore he is mostly likely experiencing a canker sore.

## 2014-11-09 NOTE — Telephone Encounter (Signed)
Pt aware.

## 2016-04-26 ENCOUNTER — Emergency Department (HOSPITAL_COMMUNITY)
Admission: EM | Admit: 2016-04-26 | Discharge: 2016-04-26 | Disposition: A | Discharge status: Home / Self Care | Payer: No Typology Code available for payment source | Attending: Emergency Medicine | Admitting: Emergency Medicine

## 2016-04-26 ENCOUNTER — Encounter (HOSPITAL_COMMUNITY): Payer: Self-pay | Admitting: Emergency Medicine

## 2016-04-26 ENCOUNTER — Emergency Department (HOSPITAL_COMMUNITY): Payer: No Typology Code available for payment source

## 2016-04-26 DIAGNOSIS — Y9389 Activity, other specified: Secondary | ICD-10-CM | POA: Diagnosis not present

## 2016-04-26 DIAGNOSIS — S61511A Laceration without foreign body of right wrist, initial encounter: Secondary | ICD-10-CM | POA: Diagnosis not present

## 2016-04-26 DIAGNOSIS — Y999 Unspecified external cause status: Secondary | ICD-10-CM | POA: Insufficient documentation

## 2016-04-26 DIAGNOSIS — Z87891 Personal history of nicotine dependence: Secondary | ICD-10-CM | POA: Diagnosis not present

## 2016-04-26 DIAGNOSIS — I10 Essential (primary) hypertension: Secondary | ICD-10-CM | POA: Diagnosis not present

## 2016-04-26 DIAGNOSIS — Y9289 Other specified places as the place of occurrence of the external cause: Secondary | ICD-10-CM | POA: Diagnosis not present

## 2016-04-26 DIAGNOSIS — S0990XA Unspecified injury of head, initial encounter: Secondary | ICD-10-CM | POA: Diagnosis not present

## 2016-04-26 DIAGNOSIS — S0181XA Laceration without foreign body of other part of head, initial encounter: Secondary | ICD-10-CM | POA: Diagnosis not present

## 2016-04-26 DIAGNOSIS — S41111A Laceration without foreign body of right upper arm, initial encounter: Secondary | ICD-10-CM

## 2016-04-26 LAB — ETHANOL: Alcohol, Ethyl (B): <5 mg/dL (ref <5)
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
## 2016-04-26 NOTE — ED Notes (Signed)
Patient Alert and oriented X4. Stable and ambulatory. Patient verbalized understanding of the discharge instructions.  Patient belongings were taken by the patient.  
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
## 2016-12-03 ENCOUNTER — Encounter (HOSPITAL_COMMUNITY): Payer: Self-pay | Admitting: Emergency Medicine

## 2016-12-03 ENCOUNTER — Emergency Department (HOSPITAL_COMMUNITY)
Admission: EM | Admit: 2016-12-03 | Discharge: 2016-12-04 | Disposition: A | Discharge status: Home / Self Care | Payer: BLUE CROSS/BLUE SHIELD | Attending: Emergency Medicine | Admitting: Emergency Medicine

## 2016-12-03 DIAGNOSIS — F1193 Opioid use, unspecified with withdrawal: Secondary | ICD-10-CM

## 2016-12-03 DIAGNOSIS — Z87891 Personal history of nicotine dependence: Secondary | ICD-10-CM | POA: Diagnosis not present

## 2016-12-03 DIAGNOSIS — Z79899 Other long term (current) drug therapy: Secondary | ICD-10-CM | POA: Diagnosis not present

## 2016-12-03 DIAGNOSIS — F1123 Opioid dependence with withdrawal: Secondary | ICD-10-CM | POA: Insufficient documentation

## 2016-12-03 DIAGNOSIS — I10 Essential (primary) hypertension: Secondary | ICD-10-CM | POA: Diagnosis not present

## 2016-12-03 NOTE — ED Triage Notes (Signed)
Pt states that he is detoxing from heroin and has not used in 24 hours. C/O abdominal cramping, pain all over and fatigue. Alert and oriented. Denies SI/HI.

## 2016-12-04 MED ORDER — SODIUM CHLORIDE 0.9 % IV BOLUS (SEPSIS)
1000.0000 mL | Freq: Once | INTRAVENOUS | Status: AC
  Administered 2016-12-04: 1000 mL via INTRAVENOUS

## 2016-12-04 MED ORDER — ONDANSETRON 4 MG PO TBDP: 4.0000 mg | ORAL_TABLET | Freq: Three times a day (TID) | ORAL | 0 refills | Status: AC | PRN

## 2016-12-04 MED ORDER — DICYCLOMINE HCL 10 MG/ML IM SOLN
20.0000 mg | Freq: Once | INTRAMUSCULAR | Status: AC
  Administered 2016-12-04: 20 mg via INTRAMUSCULAR
  Filled 2016-12-04: qty 2

## 2016-12-04 MED ORDER — CLONIDINE HCL 0.1 MG PO TABS
0.1000 mg | ORAL_TABLET | Freq: Once | ORAL | Status: AC
  Administered 2016-12-04: 0.1 mg via ORAL
  Filled 2016-12-04: qty 1

## 2016-12-04 MED ORDER — DICYCLOMINE HCL 20 MG PO TABS: 20.0000 mg | ORAL_TABLET | Freq: Two times a day (BID) | ORAL | 0 refills | Status: AC | PRN

## 2016-12-04 MED ORDER — FAMOTIDINE IN NACL 20-0.9 MG/50ML-% IV SOLN
20.0000 mg | Freq: Once | INTRAVENOUS | Status: AC
  Administered 2016-12-04: 20 mg via INTRAVENOUS
  Filled 2016-12-04: qty 50

## 2016-12-04 MED ORDER — ONDANSETRON HCL 4 MG/2ML IJ SOLN
4.0000 mg | Freq: Once | INTRAMUSCULAR | Status: AC
  Administered 2016-12-04: 4 mg via INTRAVENOUS
  Filled 2016-12-04: qty 2

## 2016-12-04 MED ORDER — CLONIDINE HCL 0.1 MG PO TABS: 0.1000 mg | ORAL_TABLET | Freq: Two times a day (BID) | ORAL | 0 refills | Status: AC | PRN

## 2016-12-04 MED ORDER — KETOROLAC TROMETHAMINE 15 MG/ML IJ SOLN
30.0000 mg | Freq: Once | INTRAMUSCULAR | Status: AC
  Administered 2016-12-04: 30 mg via INTRAVENOUS
  Filled 2016-12-04: qty 2

## 2016-12-04 NOTE — ED Notes (Signed)
This RN attempted IV start x2 unsuccessful. Barth Kirks, RN at bedside.

## 2016-12-04 NOTE — ED Provider Notes (Signed)
[facility]-EMERGENCY DEPT Provider Note   CSN: [id number] Arrival date & time: 12/03/16  2213  By signing my name below, I, Majel Homer, attest that this documentation has been prepared under the direction and in the presence of [employer], PA-C. Electronically Signed: Majel Homer, Scribe. 12/04/2016. 12:11 AM.   History   Chief Complaint Chief Complaint  Patient presents with  . Opiate Detox   The history is provided by the patient. No language interpreter was used.   HPI Comments: Charles Hunter is a 31 y.o. male with PMHx of IV heroine abuse, HTN, Hepatitis C, and alcohol dependence, who presents to the Emergency Department for an evaluation of opiate withdrawal symptoms. Pt reports associated nausea, chills, sensation of feeling "ancy," generalized pain, and abdominal cramping. He states he has experienced multiple episodes of dry heaves but denies any vomiting. He notes he last used heroine ~24 hours PTA. Pt reports he is trying to admit himself to a facility for detox but was told he needs to detox first and is requesting resources in the ED. He denies any other complaints.    Past Medical History:  Diagnosis Date  . Alcohol dependence (HCC)   . Chicken pox   . Hepatitis C   . Hypertension   . Substance abuse     Patient Active Problem List   Diagnosis Date Noted  . Sore in mouth 11/07/2014  . Rash and nonspecific skin eruption 11/07/2014  . Pain in the abdomen 10/04/2011  . SINUSITIS- ACUTE-NOS 12/07/2009  . URI 07/05/2008  . SCOLIOSIS 07/05/2008  . FRACTURE, ARM 07/05/2008  . WISDOM TEETH EXTRACTION, HX OF 07/05/2008    Past Surgical History:  Procedure Laterality Date  . HERNIA REPAIR  08/09       Home Medications    Prior to Admission medications   Medication Sig Start Date End Date Taking? Authorizing Provider  cloNIDine (CATAPRES) 0.1 MG tablet Take 1 tablet (0.1 mg total) by mouth 2 (two) times daily as needed (for agitation due to withdrawal). 12/04/16    Antony Madura, PA-C  dicyclomine (BENTYL) 20 MG tablet Take 1 tablet (20 mg total) by mouth 2 (two) times daily as needed for spasms (abdominal pain/cramping). 12/04/16   Antony Madura, PA-C  ondansetron (ZOFRAN ODT) 4 MG disintegrating tablet Take 1 tablet (4 mg total) by mouth every 8 (eight) hours as needed for nausea or vomiting. 12/04/16   Antony Madura, PA-C    Family History Family History  Problem Relation Age of Onset  . Healthy Mother   . Healthy Maternal Grandmother   . Healthy Maternal Grandfather   . Healthy Paternal Grandmother   . Healthy Paternal Grandfather     Social History Social History  Substance Use Topics  . Smoking status: Former Smoker    Packs/day: 0.50    Quit date: 04/07/2014  . Smokeless tobacco: Never Used  . Alcohol use No     Comment: Sober for almost a year     Allergies   Patient has no known allergies.   Review of Systems Review of Systems  Constitutional: Positive for chills.  Gastrointestinal: Positive for abdominal pain and nausea. Negative for vomiting.  Musculoskeletal: Positive for myalgias.  Ten systems reviewed and are negative for acute change, except as noted in the HPI.    Physical Exam Updated Vital Signs BP (!) 150/95 (BP Location: Left Arm)   Pulse (!) 105   Temp 98.5 F (36.9 C) (Oral)   Resp 18   Ht  6' (1.829 m)   Wt 197 lb 2 oz (89.4 kg)   SpO2 100%   BMI 26.73 kg/m   Physical Exam  Constitutional: He is oriented to person, place, and time. He appears well-developed and well-nourished. No distress.  Nontoxic and in NAD  HENT:  Head: Normocephalic and atraumatic.  Eyes: Conjunctivae and EOM are normal.  Neck: Normal range of motion.  Cardiovascular: Regular rhythm and intact distal pulses.   Mild tachycardia  Pulmonary/Chest: Effort normal and breath sounds normal. No respiratory distress. He has no wheezes. He has no rales.  Lungs CTAB  Abdominal: Soft. He exhibits no distension. There is tenderness.    Generalized TTP. Abdomen soft. No rigidity or peritoneal signs.  Musculoskeletal: Normal range of motion.  Neurological: He is alert and oriented to person, place, and time. He exhibits normal muscle tone. Coordination normal.  Skin: Skin is warm and dry. Capillary refill takes less than 2 seconds. He is not diaphoretic.  Psychiatric: His speech is normal. Judgment normal. His mood appears anxious (mild). He is agitated (mild).  Nursing note and vitals reviewed.   ED Treatments / Results  DIAGNOSTIC STUDIES:  Oxygen Saturation is 100% on RA, normal by my interpretation.    COORDINATION OF CARE:  12:10 AM Discussed treatment plan with pt at bedside and pt agreed to plan.  Labs (all labs ordered are listed, but only abnormal results are displayed) Labs Reviewed - No data to display  EKG  EKG Interpretation None       Radiology No results found.  Procedures Procedures (including critical care time)  Medications Ordered in ED Medications  sodium chloride 0.9 % bolus 1,000 mL (1,000 mLs Intravenous New Bag/Given 12/04/16 0148)  cloNIDine (CATAPRES) tablet 0.1 mg (0.1 mg Oral Given 12/04/16 0149)  famotidine (PEPCID) IVPB 20 mg premix (20 mg Intravenous New Bag/Given 12/04/16 0207)  ondansetron (ZOFRAN) injection 4 mg (4 mg Intravenous Given 12/04/16 0151)  ketorolac (TORADOL) 15 MG/ML injection 30 mg (30 mg Intravenous Given 12/04/16 0155)  dicyclomine (BENTYL) injection 20 mg (20 mg Intramuscular Given 12/04/16 0203)     Initial Impression / Assessment and Plan / ED Course  I have reviewed the triage vital signs and the nursing notes.  Pertinent labs & imaging results that were available during my care of the patient were reviewed by me and considered in my medical decision making (see chart for details).     31 year old male presents to the emergency department for complaints of symptoms of opiate withdrawal. Patient with a history of IV heroin abuse. He has been  hemodynamically stable since arrival. Tachycardia improved with IV fluids. Patient complaining of nausea, agitation, and abdominal pain. These have greatly improved with supportive management. Will continue with outpatient detox. Patient given prescriptions for continued supportive care. Resource guide provided regarding detox and rehabilitation facilities in the area. Patient discharged in satisfactory condition with no unaddressed concerns.  I personally performed the services described in this documentation, which was scribed in my presence. The recorded information has been reviewed and is accurate.    Vitals:   12/03/16 2308 12/04/16 0110 12/04/16 0149 12/04/16 0200  BP: (!) 150/95 127/77 127/77 120/78  Pulse: (!) 105 94    Resp: 18 18    Temp: 98.5 F (36.9 C)     TempSrc: Oral     SpO2: 100% 98%    Weight:      Height:        Final Clinical Impressions(s) / ED Diagnoses  Final diagnoses:  Opiate withdrawal (HCC)    New Prescriptions New Prescriptions   CLONIDINE (CATAPRES) 0.1 MG TABLET    Take 1 tablet (0.1 mg total) by mouth 2 (two) times daily as needed (for agitation due to withdrawal).   DICYCLOMINE (BENTYL) 20 MG TABLET    Take 1 tablet (20 mg total) by mouth 2 (two) times daily as needed for spasms (abdominal pain/cramping).   ONDANSETRON (ZOFRAN ODT) 4 MG DISINTEGRATING TABLET    Take 1 tablet (4 mg total) by mouth every 8 (eight) hours as needed for nausea or vomiting.     Antony Madura, PA-C 12/04/16 0255    Azalia Bilis, MD 12/04/16 [phone]
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
## 2020-01-28 ENCOUNTER — Ambulatory Visit: Payer: BLUE CROSS/BLUE SHIELD | Attending: Internal Medicine
